# Patient Record
Sex: Male | Born: 1975 | Hispanic: No | Marital: Single | Smoking: Never smoker
Health system: Southern US, Community
[De-identification: ages and names within clinical notes are randomized; demographics above are authoritative.]

---

## 1999-12-14 ENCOUNTER — Emergency Department (HOSPITAL_COMMUNITY): Admission: EM | Admit: 1999-12-14 | Discharge: 1999-12-14 | Payer: Self-pay | Admitting: Emergency Medicine

## 1999-12-14 ENCOUNTER — Encounter: Payer: Self-pay | Admitting: Emergency Medicine

## 2003-02-18 ENCOUNTER — Observation Stay (HOSPITAL_COMMUNITY): Admission: RE | Admit: 2003-02-18 | Discharge: 2003-02-19 | Payer: Self-pay | Admitting: Specialist

## 2003-02-18 ENCOUNTER — Encounter: Payer: Self-pay | Admitting: Specialist

## 2019-02-17 ENCOUNTER — Emergency Department (HOSPITAL_COMMUNITY): Payer: Managed Care, Other (non HMO)

## 2019-02-17 ENCOUNTER — Emergency Department (HOSPITAL_COMMUNITY)
Admission: EM | Admit: 2019-02-17 | Discharge: 2019-02-17 | Disposition: A | Discharge status: Home / Self Care | Payer: Managed Care, Other (non HMO) | Source: Home / Self Care | Attending: Emergency Medicine | Admitting: Emergency Medicine

## 2019-02-17 ENCOUNTER — Encounter (HOSPITAL_COMMUNITY): Payer: Self-pay | Admitting: Emergency Medicine

## 2019-02-17 ENCOUNTER — Other Ambulatory Visit: Payer: Self-pay

## 2019-02-17 DIAGNOSIS — R0902 Hypoxemia: Secondary | ICD-10-CM | POA: Diagnosis not present

## 2019-02-17 DIAGNOSIS — U071 COVID-19: Secondary | ICD-10-CM | POA: Diagnosis not present

## 2019-02-17 DIAGNOSIS — E860 Dehydration: Secondary | ICD-10-CM

## 2019-02-17 LAB — CBC WITH DIFFERENTIAL/PLATELET
Abs Immature Granulocytes: 0.01 10*3/uL (ref 0.00–0.07)
Basophils Absolute: 0 10*3/uL (ref 0.0–0.1)
Basophils Relative: 0 %
Eosinophils Absolute: 0 10*3/uL (ref 0.0–0.5)
Eosinophils Relative: 0 %
HCT: 50.4 % (ref 39.0–52.0)
Hemoglobin: 15 g/dL (ref 13.0–17.0)
Immature Granulocytes: 0 %
Lymphocytes Relative: 19 %
Lymphs Abs: 1 10*3/uL (ref 0.7–4.0)
MCH: 22.3 pg — ABNORMAL LOW (ref 26.0–34.0)
MCHC: 29.8 g/dL — ABNORMAL LOW (ref 30.0–36.0)
MCV: 75 fL — ABNORMAL LOW (ref 80.0–100.0)
Monocytes Absolute: 0.3 10*3/uL (ref 0.1–1.0)
Monocytes Relative: 6 %
Neutro Abs: 3.9 10*3/uL (ref 1.7–7.7)
Neutrophils Relative %: 75 %
Platelets: 128 10*3/uL — ABNORMAL LOW (ref 150–400)
RBC: 6.72 MIL/uL — ABNORMAL HIGH (ref 4.22–5.81)
RDW: 16 % — ABNORMAL HIGH (ref 11.5–15.5)
WBC: 5.2 10*3/uL (ref 4.0–10.5)
nRBC: 0 % (ref 0.0–0.2)

## 2019-02-17 LAB — COMPREHENSIVE METABOLIC PANEL
ALT: 66 U/L — ABNORMAL HIGH (ref 0–44)
AST: 49 U/L — ABNORMAL HIGH (ref 15–41)
Albumin: 3.6 g/dL (ref 3.5–5.0)
Alkaline Phosphatase: 102 U/L (ref 38–126)
Anion gap: 8 (ref 5–15)
BUN: 17 mg/dL (ref 6–20)
CO2: 20 mmol/L — ABNORMAL LOW (ref 22–32)
Calcium: 10.5 mg/dL — ABNORMAL HIGH (ref 8.9–10.3)
Chloride: 107 mmol/L (ref 98–111)
Creatinine, Ser: 0.92 mg/dL (ref 0.61–1.24)
GFR calc Af Amer: >60 mL/min (ref >60)
GFR calc non Af Amer: >60 mL/min (ref >60)
Glucose, Bld: 123 mg/dL — ABNORMAL HIGH (ref 70–99)
Potassium: 3.9 mmol/L (ref 3.5–5.1)
Sodium: 135 mmol/L (ref 135–145)
Total Bilirubin: 0.7 mg/dL (ref 0.3–1.2)
Total Protein: 6.8 g/dL (ref 6.5–8.1)

## 2019-02-17 LAB — SARS CORONAVIRUS 2 BY RT PCR (HOSPITAL ORDER, PERFORMED IN ~~LOC~~ HOSPITAL LAB): SARS Coronavirus 2: POSITIVE — AB

## 2019-02-17 LAB — TRIGLYCERIDES: Triglycerides: 101 mg/dL (ref <150)

## 2019-02-17 LAB — D-DIMER, QUANTITATIVE: D-Dimer, Quant: 0.49 ug/mL-FEU (ref 0.00–0.50)

## 2019-02-17 LAB — FERRITIN: Ferritin: 739 ng/mL — ABNORMAL HIGH (ref 24–336)

## 2019-02-17 LAB — C-REACTIVE PROTEIN: CRP: 4 mg/dL — ABNORMAL HIGH (ref <1.0)

## 2019-02-17 LAB — FIBRINOGEN: Fibrinogen: 454 mg/dL (ref 210–475)

## 2019-02-17 LAB — PROCALCITONIN: Procalcitonin: 0.14 ng/mL

## 2019-02-17 LAB — LACTIC ACID, PLASMA: Lactic Acid, Venous: 0.9 mmol/L (ref 0.5–1.9)

## 2019-02-17 LAB — LACTATE DEHYDROGENASE: LDH: 216 U/L — ABNORMAL HIGH (ref 98–192)

## 2019-02-17 MED ORDER — SODIUM CHLORIDE 0.9 % IV BOLUS
1000.0000 mL | Freq: Once | INTRAVENOUS | Status: AC
  Administered 2019-02-17: 04:00:00 1000 mL via INTRAVENOUS

## 2019-02-17 MED ORDER — METOCLOPRAMIDE HCL 5 MG/ML IJ SOLN
10.0000 mg | Freq: Once | INTRAMUSCULAR | Status: AC
  Administered 2019-02-17: 04:00:00 10 mg via INTRAVENOUS
  Filled 2019-02-17: qty 2

## 2019-02-17 MED ORDER — BENZONATATE 100 MG PO CAPS: 100.0000 mg | ORAL_CAPSULE | Freq: Three times a day (TID) | ORAL | 0 refills | Status: AC

## 2019-02-17 MED ORDER — ONDANSETRON HCL 4 MG PO TABS: 4.0000 mg | ORAL_TABLET | Freq: Three times a day (TID) | ORAL | 0 refills | Status: AC | PRN

## 2019-02-17 NOTE — Discharge Instructions (Addendum)
You tested positive for covid. This is a virus that needs to be treated symptomatically.  Use zofran as needed for nausea and vomiting.  Make sure you are staying well hydrated with water.  Use tylenol/ibuprofen as needed for fevers and/or body aches.  Use tessalon as needed for cough.  Isolate/quarantine until 7 days after symptoms began as long as you are also fever free for 72 hours without medication and your symptoms are improving.  Monitor your symptoms closely. If you are having increased difficulty breathing, return to the ER. Return with any new, worsening, or concerning symptoms.

## 2019-02-17 NOTE — ED Triage Notes (Signed)
Patient BIB [facility] from home for dizziness, weakness, emesis, loss of taste and smell. Patients son and several other family members have tested positive for covid-19. Patient has not been tested. Patient reports taking alive for fever at home. Pt ambulatory but dizzy and states the room is spinning when lying down. Pt given zofran by ems that helped briefly as well as 500 ml NS.

## 2019-02-17 NOTE — ED Provider Notes (Signed)
[facility]-EMERGENCY DEPT Provider Note   CSN: [id number] Arrival date & time: 02/17/19  0123     History   Chief Complaint Chief Complaint  Patient presents with  . Dizziness  . Emesis    HPI George Gibson is a 43 y.o. male presenting for evaluation of dizziness, vomiting, fever, loss of taste/smell.  Patient states he started to develop symptoms 5 days ago.  Each day he develops any symptoms.  It began with myalgias, and he developed a fever, the loss of taste/smell.  He has developed a mild cough.  Today he has had multiple episodes of vomiting and reports dizziness, this is worse when sitting/standing.  Patient states all of his family members have tested positive for.  Including his mom, dad, and brother.  His brother was sick first.  Patient has not been tested.  He has not been taking anything for his symptoms.  He denies chest pain, shortness of breath, abdominal pain, urinary symptoms, abnormal bowel movements.  He reports a history of asthma for which he takes his inhaler every day, denies wheezing.  States he has no other medical problems.     HPI  History reviewed. No pertinent past medical history.  There are no active problems to display for this patient.   History reviewed. No pertinent surgical history.      Home Medications    Prior to Admission medications   Medication Sig Start Date End Date Taking? Authorizing Provider  naproxen sodium (ALEVE) 220 MG tablet Take 440 mg by mouth 2 (two) times daily as needed (pain).   Yes [provider]  benzonatate (TESSALON) 100 MG capsule Take 1 capsule (100 mg total) by mouth every 8 (eight) hours. 02/17/19   Dwyane Dupree, PA-C  ondansetron (ZOFRAN) 4 MG tablet Take 1 tablet (4 mg total) by mouth every 8 (eight) hours as needed. 02/17/19   Debria Broecker, PA-C    Family History History reviewed. No pertinent family history.  Social History Social History   Tobacco Use  .  Smoking status: Never Smoker  . Smokeless tobacco: Never Used  Substance Use Topics  . Alcohol use: Never    Frequency: Never  . Drug use: Never     Allergies   Patient has no known allergies.   Review of Systems Review of Systems  Constitutional: Positive for fever.  Respiratory: Positive for cough.   Gastrointestinal: Positive for nausea and vomiting.  Musculoskeletal: Positive for myalgias.  Neurological: Positive for dizziness and weakness.  All other systems reviewed and are negative.    Physical Exam Updated Vital Signs BP 117/81   Pulse 87   Temp 98.6 F (37 C) (Oral)   Resp 17   Ht 5\' 8"  (1.727 m)   Wt 127 kg   SpO2 92%   BMI 42.57 kg/m   Physical Exam Vitals signs and nursing note reviewed.  Constitutional:      General: He is not in acute distress.    Appearance: He is well-developed.     Comments: Obese M who appears nontoxic  HENT:     Head: Normocephalic and atraumatic.  Eyes:     Extraocular Movements: Extraocular movements intact.     Conjunctiva/sclera: Conjunctivae normal.     Pupils: Pupils are equal, round, and reactive to light.  Neck:     Musculoskeletal: Normal range of motion and neck supple.  Cardiovascular:     Rate and Rhythm: Normal rate and regular rhythm.  Pulses: Normal pulses.  Pulmonary:     Effort: Pulmonary effort is normal. No respiratory distress.     Breath sounds: Normal breath sounds. No wheezing.     Comments: Speaking in full sentences.  Clear lung sounds in all fields.  No signs of respiratory distress.  Patient ambulated without significant tachypnea or difficulty breathing. Abdominal:     General: There is no distension.     Palpations: Abdomen is soft. There is no mass.     Tenderness: There is no abdominal tenderness. There is no guarding or rebound.  Musculoskeletal: Normal range of motion.  Skin:    General: Skin is warm and dry.     Capillary Refill: Capillary refill takes less than 2 seconds.   Neurological:     Mental Status: He is alert and oriented to person, place, and time.      ED Treatments / Results  Labs (all labs ordered are listed, but only abnormal results are displayed) Labs Reviewed  SARS CORONAVIRUS 2 (HOSPITAL ORDER, PERFORMED IN [facility]) - Abnormal; Notable for the following components:      Result Value   SARS Coronavirus 2 POSITIVE (*)    All other components within normal limits  CBC WITH DIFFERENTIAL/PLATELET - Abnormal; Notable for the following components:   RBC 6.72 (*)    MCV 75.0 (*)    MCH 22.3 (*)    MCHC 29.8 (*)    RDW 16.0 (*)    Platelets 128 (*)    All other components within normal limits  COMPREHENSIVE METABOLIC PANEL - Abnormal; Notable for the following components:   CO2 20 (*)    Glucose, Bld 123 (*)    Calcium 10.5 (*)    AST 49 (*)    ALT 66 (*)    All other components within normal limits  LACTATE DEHYDROGENASE - Abnormal; Notable for the following components:   LDH 216 (*)    All other components within normal limits  FERRITIN - Abnormal; Notable for the following components:   Ferritin 739 (*)    All other components within normal limits  C-REACTIVE PROTEIN - Abnormal; Notable for the following components:   CRP 4.0 (*)    All other components within normal limits  CULTURE, BLOOD (ROUTINE X 2)  CULTURE, BLOOD (ROUTINE X 2)  LACTIC ACID, PLASMA  D-DIMER, QUANTITATIVE (NOT [facility])  PROCALCITONIN  TRIGLYCERIDES  FIBRINOGEN  LACTIC ACID, PLASMA    EKG EKG Interpretation  Date/Time:  Saturday February 17 2019 02:50:21 EDT Ventricular Rate:  89 PR Interval:    QRS Duration: 94 QT Interval:  346 QTC Calculation: 421 R Axis:   19 Text Interpretation:  Sinus rhythm Low voltage, precordial leads RSR' in V1 or V2, right VCD or RVH ST elev, probable normal early repol pattern No acute changes No old tracing to compare Confirmed by Derwood Kaplananavati, Ankit [phone]([id number]) on 02/17/2019 3:53:07 AM   Radiology Dg Chest  Port 1 View  Result Date: 02/17/2019 CLINICAL DATA:  Shortness of breath history of COVID-19 exposure EXAM: PORTABLE CHEST 1 VIEW COMPARISON:  None. FINDINGS: No focal airspace disease or effusion. Normal cardiomediastinal silhouette. No pneumothorax. IMPRESSION: No active disease. Electronically Signed   By: Jasmine PangKim  Fujinaga M.D.   On: 02/17/2019 03:51    Procedures Procedures (including critical care time)  Medications Ordered in ED Medications  sodium chloride 0.9 % bolus 1,000 mL (1,000 mLs Intravenous New Bag/Given 02/17/19 0344)  metoCLOPramide (REGLAN) injection 10 mg (10 mg  Intravenous Given 02/17/19 0345)     Initial Impression / Assessment and Plan / ED Course  I have reviewed the triage vital signs and the nursing notes.  Pertinent labs & imaging results that were available during my care of the patient were reviewed by me and considered in my medical decision making (see chart for details).        Patient presenting for evaluation of COVID-like symptoms.  Physical exam shows patient appears nontoxic.  He is reporting dizziness today, which is present all the time, though worse with standing.  I ambulated patient in the room, became slightly tachycardic upon standing, but no significant change in his blood pressure.  He was able to ambulate without respiratory distress.  Will order COVID labs and test.  Will order chest x-ray.  Fluid bolus given for likely dehydration, Reglan for nausea.  EKG without stemi, but shows signs of possible PE. Will follow ddimer to see if pt needs to have cta.  Chest x-ray viewed interpreted by me, no pneumonia pneumothorax, effusion, cardiomegaly.  Labs consistent with COVID.  No leukocytosis.  CRP elevated.  LDH and ferritin elevated.  Dimer negative, will hold on CTA at this time.  On reassessment, patient reports his dizziness is improved.  He was able to sit up and move around without recurrence of dizziness.  As such, likely due to dehydration.   Low suspicion for stroke at this time.  Discussed findings with patient.  Discussed close monitoring of symptoms, and returning if respiratory status worsens.  At this time, patient appears safe for discharge.  Return precautions given.  Patient states he understands and agrees to plan.  George Gibson was evaluated in Emergency Department on 02/17/2019 for the symptoms described in the history of present illness. He was evaluated in the context of the global COVID-19 pandemic, which necessitated consideration that the patient might be at risk for infection with the SARS-CoV-2 virus that causes COVID-19. Institutional protocols and algorithms that pertain to the evaluation of patients at risk for COVID-19 are in a state of rapid change based on information released by regulatory bodies including the CDC and federal and state organizations. These policies and algorithms were followed during the patient's care in the ED.  Final Clinical Impressions(s) / ED Diagnoses   Final diagnoses:  COVID-19  Dehydration    ED Discharge Orders         Ordered    ondansetron (ZOFRAN) 4 MG tablet  Every 8 hours PRN     02/17/19 0615    benzonatate (TESSALON) 100 MG capsule  Every 8 hours     02/17/19 0615           Azara Gemme, PA-C 02/17/19 0347    Varney Biles, MD 02/18/19 [phone]

## 2019-02-20 ENCOUNTER — Emergency Department (HOSPITAL_COMMUNITY): Payer: Managed Care, Other (non HMO)

## 2019-02-20 ENCOUNTER — Inpatient Hospital Stay (HOSPITAL_COMMUNITY)
Admission: EM | Admit: 2019-02-20 | Discharge: 2019-02-26 | DRG: 177 | Disposition: A | Discharge status: Home / Self Care | Payer: Managed Care, Other (non HMO) | Attending: Family Medicine | Admitting: Family Medicine

## 2019-02-20 ENCOUNTER — Encounter (HOSPITAL_COMMUNITY): Payer: Self-pay | Admitting: Emergency Medicine

## 2019-02-20 DIAGNOSIS — Z7951 Long term (current) use of inhaled steroids: Secondary | ICD-10-CM | POA: Diagnosis not present

## 2019-02-20 DIAGNOSIS — U071 COVID-19: Secondary | ICD-10-CM | POA: Diagnosis not present

## 2019-02-20 DIAGNOSIS — J069 Acute upper respiratory infection, unspecified: Secondary | ICD-10-CM

## 2019-02-20 DIAGNOSIS — Z6841 Body Mass Index (BMI) 40.0 and over, adult: Secondary | ICD-10-CM

## 2019-02-20 DIAGNOSIS — R0902 Hypoxemia: Secondary | ICD-10-CM

## 2019-02-20 DIAGNOSIS — J45909 Unspecified asthma, uncomplicated: Secondary | ICD-10-CM | POA: Diagnosis present

## 2019-02-20 DIAGNOSIS — Z79899 Other long term (current) drug therapy: Secondary | ICD-10-CM

## 2019-02-20 DIAGNOSIS — E785 Hyperlipidemia, unspecified: Secondary | ICD-10-CM | POA: Diagnosis present

## 2019-02-20 DIAGNOSIS — E860 Dehydration: Secondary | ICD-10-CM | POA: Diagnosis present

## 2019-02-20 DIAGNOSIS — J1289 Other viral pneumonia: Secondary | ICD-10-CM | POA: Diagnosis present

## 2019-02-20 DIAGNOSIS — E875 Hyperkalemia: Secondary | ICD-10-CM | POA: Diagnosis present

## 2019-02-20 DIAGNOSIS — J9601 Acute respiratory failure with hypoxia: Secondary | ICD-10-CM | POA: Diagnosis present

## 2019-02-20 DIAGNOSIS — N179 Acute kidney failure, unspecified: Secondary | ICD-10-CM | POA: Diagnosis present

## 2019-02-20 LAB — CBC WITH DIFFERENTIAL/PLATELET
Abs Immature Granulocytes: 0.02 10*3/uL (ref 0.00–0.07)
Basophils Absolute: 0 10*3/uL (ref 0.0–0.1)
Basophils Relative: 0 %
Eosinophils Absolute: 0 10*3/uL (ref 0.0–0.5)
Eosinophils Relative: 0 %
HCT: 52.8 % — ABNORMAL HIGH (ref 39.0–52.0)
Hemoglobin: 15.5 g/dL (ref 13.0–17.0)
Immature Granulocytes: 0 %
Lymphocytes Relative: 15 %
Lymphs Abs: 1 10*3/uL (ref 0.7–4.0)
MCH: 21.8 pg — ABNORMAL LOW (ref 26.0–34.0)
MCHC: 29.4 g/dL — ABNORMAL LOW (ref 30.0–36.0)
MCV: 74.2 fL — ABNORMAL LOW (ref 80.0–100.0)
Monocytes Absolute: 0.3 10*3/uL (ref 0.1–1.0)
Monocytes Relative: 4 %
Neutro Abs: 5.4 10*3/uL (ref 1.7–7.7)
Neutrophils Relative %: 81 %
Platelets: 190 10*3/uL (ref 150–400)
RBC: 7.12 MIL/uL — ABNORMAL HIGH (ref 4.22–5.81)
RDW: 15.6 % — ABNORMAL HIGH (ref 11.5–15.5)
WBC: 6.7 10*3/uL (ref 4.0–10.5)
nRBC: 0 % (ref 0.0–0.2)

## 2019-02-20 LAB — D-DIMER, QUANTITATIVE: D-Dimer, Quant: 0.81 ug/mL-FEU — ABNORMAL HIGH (ref 0.00–0.50)

## 2019-02-20 LAB — LACTIC ACID, PLASMA: Lactic Acid, Venous: 1.2 mmol/L (ref 0.5–1.9)

## 2019-02-20 LAB — COMPREHENSIVE METABOLIC PANEL
ALT: 59 U/L — ABNORMAL HIGH (ref 0–44)
AST: 114 U/L — ABNORMAL HIGH (ref 15–41)
Albumin: 3.6 g/dL (ref 3.5–5.0)
Alkaline Phosphatase: 80 U/L (ref 38–126)
Anion gap: 11 (ref 5–15)
BUN: 17 mg/dL (ref 6–20)
CO2: 22 mmol/L (ref 22–32)
Calcium: 10.8 mg/dL — ABNORMAL HIGH (ref 8.9–10.3)
Chloride: 100 mmol/L (ref 98–111)
Creatinine, Ser: 1.4 mg/dL — ABNORMAL HIGH (ref 0.61–1.24)
GFR calc Af Amer: >60 mL/min (ref >60)
GFR calc non Af Amer: >60 mL/min (ref >60)
Glucose, Bld: 91 mg/dL (ref 70–99)
Potassium: 4.4 mmol/L (ref 3.5–5.1)
Sodium: 133 mmol/L — ABNORMAL LOW (ref 135–145)
Total Bilirubin: 0.8 mg/dL (ref 0.3–1.2)
Total Protein: 7.5 g/dL (ref 6.5–8.1)

## 2019-02-20 LAB — C-REACTIVE PROTEIN: CRP: 15 mg/dL — ABNORMAL HIGH (ref <1.0)

## 2019-02-20 LAB — FERRITIN: Ferritin: 1346 ng/mL — ABNORMAL HIGH (ref 24–336)

## 2019-02-20 LAB — LACTATE DEHYDROGENASE: LDH: 550 U/L — ABNORMAL HIGH (ref 98–192)

## 2019-02-20 LAB — FIBRINOGEN: Fibrinogen: 292 mg/dL (ref 210–475)

## 2019-02-20 LAB — TRIGLYCERIDES: Triglycerides: 94 mg/dL (ref <150)

## 2019-02-20 LAB — PROCALCITONIN: Procalcitonin: 0.79 ng/mL

## 2019-02-20 MED ORDER — SODIUM CHLORIDE 0.9 % IV SOLN
500.0000 mg | INTRAVENOUS | Status: DC
  Administered 2019-02-20: 500 mg via INTRAVENOUS
  Filled 2019-02-20: qty 500

## 2019-02-20 MED ORDER — ENOXAPARIN SODIUM 40 MG/0.4ML ~~LOC~~ SOLN: 40.0000 mg | SUBCUTANEOUS | Status: DC

## 2019-02-20 MED ORDER — ALBUTEROL SULFATE HFA 108 (90 BASE) MCG/ACT IN AERS
1.0000 | INHALATION_SPRAY | Freq: Four times a day (QID) | RESPIRATORY_TRACT | Status: DC | PRN
  Filled 2019-02-20: qty 6.7

## 2019-02-20 MED ORDER — VITAMIN C 500 MG PO TABS
500.0000 mg | ORAL_TABLET | Freq: Every day | ORAL | Status: DC
  Administered 2019-02-21 – 2019-02-26 (×6): 500 mg via ORAL
  Filled 2019-02-20 (×6): qty 1

## 2019-02-20 MED ORDER — ONDANSETRON HCL 4 MG/2ML IJ SOLN: 4.0000 mg | Freq: Four times a day (QID) | INTRAMUSCULAR | Status: DC | PRN

## 2019-02-20 MED ORDER — POLYETHYLENE GLYCOL 3350 17 G PO PACK: 17.0000 g | PACK | Freq: Every day | ORAL | Status: DC | PRN

## 2019-02-20 MED ORDER — BENZONATATE 100 MG PO CAPS: 100.0000 mg | ORAL_CAPSULE | Freq: Three times a day (TID) | ORAL | Status: DC

## 2019-02-20 MED ORDER — METHYLPREDNISOLONE SODIUM SUCC 125 MG IJ SOLR
60.0000 mg | Freq: Two times a day (BID) | INTRAMUSCULAR | Status: DC
  Administered 2019-02-20: 60 mg via INTRAVENOUS
  Filled 2019-02-20: qty 2

## 2019-02-20 MED ORDER — MOMETASONE FURO-FORMOTEROL FUM 200-5 MCG/ACT IN AERO
2.0000 | INHALATION_SPRAY | Freq: Two times a day (BID) | RESPIRATORY_TRACT | Status: DC
  Filled 2019-02-20: qty 8.8

## 2019-02-20 MED ORDER — ENOXAPARIN SODIUM 60 MG/0.6ML ~~LOC~~ SOLN
60.0000 mg | SUBCUTANEOUS | Status: DC
  Administered 2019-02-20 – 2019-02-25 (×6): 60 mg via SUBCUTANEOUS
  Filled 2019-02-20 (×6): qty 0.6

## 2019-02-20 MED ORDER — GUAIFENESIN-DM 100-10 MG/5ML PO SYRP
10.0000 mL | ORAL_SOLUTION | ORAL | Status: DC | PRN
  Administered 2019-02-21 – 2019-02-26 (×9): 10 mL via ORAL
  Filled 2019-02-20 (×7): qty 10

## 2019-02-20 MED ORDER — ACETAMINOPHEN 325 MG PO TABS
650.0000 mg | ORAL_TABLET | Freq: Once | ORAL | Status: AC
  Administered 2019-02-20: 650 mg via ORAL
  Filled 2019-02-20: qty 2

## 2019-02-20 MED ORDER — DEXAMETHASONE SODIUM PHOSPHATE 10 MG/ML IJ SOLN
10.0000 mg | Freq: Once | INTRAMUSCULAR | Status: AC
  Administered 2019-02-20: 10 mg via INTRAVENOUS
  Filled 2019-02-20: qty 1

## 2019-02-20 MED ORDER — SODIUM CHLORIDE 0.9 % IV BOLUS
1000.0000 mL | Freq: Once | INTRAVENOUS | Status: AC
  Administered 2019-02-20: 1000 mL via INTRAVENOUS

## 2019-02-20 MED ORDER — ZINC SULFATE 220 (50 ZN) MG PO CAPS
220.0000 mg | ORAL_CAPSULE | Freq: Every day | ORAL | Status: DC
  Administered 2019-02-21 – 2019-02-26 (×6): 220 mg via ORAL
  Filled 2019-02-20 (×6): qty 1

## 2019-02-20 MED ORDER — ATORVASTATIN CALCIUM 10 MG PO TABS
20.0000 mg | ORAL_TABLET | Freq: Every day | ORAL | Status: DC
  Administered 2019-02-21 – 2019-02-26 (×6): 20 mg via ORAL
  Filled 2019-02-20 (×4): qty 1
  Filled 2019-02-20 (×5): qty 2
  Filled 2019-02-20: qty 1

## 2019-02-20 MED ORDER — LORATADINE 10 MG PO TABS
10.0000 mg | ORAL_TABLET | Freq: Every day | ORAL | Status: DC
  Administered 2019-02-21 – 2019-02-26 (×6): 10 mg via ORAL
  Filled 2019-02-20 (×6): qty 1

## 2019-02-20 MED ORDER — ONDANSETRON HCL 4 MG PO TABS: 4.0000 mg | ORAL_TABLET | Freq: Four times a day (QID) | ORAL | Status: DC | PRN

## 2019-02-20 MED ORDER — SODIUM CHLORIDE 0.9 % IV SOLN
100.0000 mg | INTRAVENOUS | Status: AC
  Administered 2019-02-21 – 2019-02-24 (×4): 100 mg via INTRAVENOUS
  Filled 2019-02-20 (×4): qty 20

## 2019-02-20 MED ORDER — SODIUM CHLORIDE 0.9 % IV SOLN
1.0000 g | INTRAVENOUS | Status: DC
  Administered 2019-02-20: 1 g via INTRAVENOUS
  Filled 2019-02-20: qty 10

## 2019-02-20 MED ORDER — SODIUM CHLORIDE 0.9 % IV SOLN: INTRAVENOUS | Status: DC

## 2019-02-20 MED ORDER — SODIUM CHLORIDE 0.9 % IV SOLN
200.0000 mg | Freq: Once | INTRAVENOUS | Status: AC
  Administered 2019-02-20: 200 mg via INTRAVENOUS
  Filled 2019-02-20: qty 40

## 2019-02-20 MED ORDER — TOCILIZUMAB 400 MG/20ML IV SOLN: 8.0000 mg/kg | Freq: Once | INTRAVENOUS | Status: DC

## 2019-02-20 MED ORDER — HYDROCOD POLST-CPM POLST ER 10-8 MG/5ML PO SUER
5.0000 mL | Freq: Two times a day (BID) | ORAL | Status: DC | PRN
  Administered 2019-02-20 – 2019-02-22 (×2): 5 mL via ORAL
  Filled 2019-02-20 (×3): qty 5

## 2019-02-20 MED ORDER — ACETAMINOPHEN 325 MG PO TABS: 650.0000 mg | ORAL_TABLET | Freq: Four times a day (QID) | ORAL | Status: DC | PRN

## 2019-02-20 MED ORDER — FLUTICASONE PROPIONATE 50 MCG/ACT NA SUSP
2.0000 | Freq: Every day | NASAL | Status: DC
  Administered 2019-02-21 – 2019-02-26 (×6): 2 via NASAL
  Filled 2019-02-20: qty 16

## 2019-02-20 NOTE — Progress Notes (Signed)
Pharmacy Brief Note: Remdesivir  O:  -COVID positive diagnosis on 02/17/2019, now returns with worsening cough, shortness of breath, dizziness, nausea -ALT: 59 -CXR: Interval development of multiple bilateral patchy airspace opacities is noted most consistent with multifocal PNA -EMS found patient with O2 sats in the 80s. Currently on 3L supplemental O2 with SpO2 94%.   A/P:  Patient meets criteria for Remdesivir therapy.  Will start Remdesivir 200mg  IV x 1, followed by 100mg  IV daily x 4 days.  CMET, CBC daily for duration of treatment course   Lindell Spar, PharmD, BCPS Pager: [phone] 02/20/2019 5:36 PM

## 2019-02-20 NOTE — ED Notes (Signed)
Hospitalist at bedside 

## 2019-02-20 NOTE — ED Notes (Signed)
[facility] Called for transport  

## 2019-02-20 NOTE — ED Triage Notes (Signed)
Pt to ED via EMS from home, pt to ED Saturday, confirmed positive COVID. Pt today c/o worsening cough, weakness, SHOB. Denies fever. Pt currently on [place] @ 4L, Sat 95-97%

## 2019-02-20 NOTE — H&P (Signed)
History and Physical  George GaleaSavann Mcnamee ZOX:[id number] DOB: 03/06/1976 DOA: 02/20/2019   Patient coming from: Home & is able to ambulate  Chief Complaint: Worsening cough, shortness of breath, fever  HPI: George Gibson is a 43 y.o. male with medical history significant for morbid obesity, presents to the ED complaining of worsening cough, shortness of breath, fever, weakness for the past couple of days.  Of note, patient presented on 02/17/2019 with similar complaints and tested positive for COVID, but did not meet inpatient criteria and was discharged.  Later returns today for worsening symptoms, in addition to nausea but no vomiting.  Reports multiple family members tested positive for COVID.  Due to worsening symptoms, patient decided to return to the ED.  ED Course: Patient noted to be satting in the 80s on room air, was placed on nonrebreather by EMS and transported to the ED.  Currently satting in the low 90s on 3 L nasal cannula.  Patient noted to be febrile, tachycardic.  Inflammatory markers are worsening.  Patient noted to have some AKI.  X-ray showed multifocal pneumonia.  [facility]  Review of Systems: Review of systems are otherwise negative   History reviewed. No pertinent past medical history. History reviewed. No pertinent surgical history.  Social History:  reports that he has never smoked. He has never used smokeless tobacco. He reports that he does not drink alcohol or use drugs.   No Known Allergies  No family history on file.    Prior to Admission medications   Medication Sig Start Date End Date Taking? Authorizing Provider  ADVAIR HFA 115-21 MCG/ACT inhaler Inhale 1 puff into the lungs 2 (two) times a day. 11/13/18  Yes [provider]  albuterol (VENTOLIN HFA) 108 (90 Base) MCG/ACT inhaler Inhale 1-2 puffs into the lungs every 6 (six) hours as needed for wheezing or shortness of breath.  09/27/18  Yes [provider]   amphetamine-dextroamphetamine (ADDERALL) 30 MG tablet Take 30 mg by mouth daily. 01/06/19  Yes [provider]  atorvastatin (LIPITOR) 20 MG tablet Take 20 mg by mouth daily. 10/06/18  Yes [provider]  benzonatate (TESSALON) 100 MG capsule Take 1 capsule (100 mg total) by mouth every 8 (eight) hours. 02/17/19  Yes Caccavale, Sophia, PA-C  cetirizine (ZYRTEC) 10 MG tablet Take 10 mg by mouth daily. 02/04/19  Yes [provider]  fluticasone (FLONASE) 50 MCG/ACT nasal spray Place 2 sprays into both nostrils daily. 12/12/18  Yes [provider]  naproxen sodium (ALEVE) 220 MG tablet Take 440 mg by mouth 2 (two) times daily as needed (pain).   Yes [provider]  ondansetron (ZOFRAN) 4 MG tablet Take 1 tablet (4 mg total) by mouth every 8 (eight) hours as needed. Patient taking differently: Take 4 mg by mouth every 8 (eight) hours as needed for nausea.  02/17/19  Yes Caccavale, Sophia, PA-C    Physical Exam: BP 99/72   Pulse (!) 124   Temp (!) 103 F (39.4 C) (Oral)   Resp (!) 32   Ht 5\' 8"  (1.727 m)   Wt 127 kg   SpO2 94%   BMI 42.57 kg/m   General: Mild distress noted Eyes: Normal ENT: Normal Neck: Supple Cardiovascular: S1, S2 present Respiratory: Poor inspiratory effort Abdomen: Soft, nontender, nondistended, bowel sounds present Skin: Normal Musculoskeletal: No pedal edema bilaterally Psychiatric: Normal mood Neurologic: No focal neurologic deficits noted          Labs on Admission:  Basic Metabolic Panel: Recent Labs  Lab 02/17/19 0309 02/20/19 1350  NA 135 133*  K 3.9 4.4  CL 107 100  CO2 20* 22  GLUCOSE 123* 91  BUN 17 17  CREATININE 0.92 1.40*  CALCIUM 10.5* 10.8*   Liver Function Tests: Recent Labs  Lab 02/17/19 0309 02/20/19 1350  AST 49* 114*  ALT 66* 59*  ALKPHOS 102 80  BILITOT 0.7 0.8  PROT 6.8 7.5  ALBUMIN 3.6 3.6   No results for input(s): LIPASE, AMYLASE in the last 168 hours. No results for  input(s): AMMONIA in the last 168 hours. CBC: Recent Labs  Lab 02/17/19 0309 02/20/19 1350  WBC 5.2 6.7  NEUTROABS 3.9 5.4  HGB 15.0 15.5  HCT 50.4 52.8*  MCV 75.0* 74.2*  PLT 128* 190   Cardiac Enzymes: No results for input(s): CKTOTAL, CKMB, CKMBINDEX, TROPONINI in the last 168 hours.  BNP (last 3 results) No results for input(s): BNP in the last 8760 hours.  ProBNP (last 3 results) No results for input(s): PROBNP in the last 8760 hours.  CBG: No results for input(s): GLUCAP in the last 168 hours.  Radiological Exams on Admission: Dg Chest Port 1 View  Result Date: 02/20/2019 CLINICAL DATA:  Cough, shortness of breath. EXAM: PORTABLE CHEST 1 VIEW COMPARISON:  Radiograph February 17, 2019. FINDINGS: Stable cardiomediastinal silhouette. Interval development of multiple patchy airspace opacities throughout both lungs. No pneumothorax or pleural effusion is noted. The visualized skeletal structures are unremarkable. IMPRESSION: Interval development of multiple bilateral patchy airspace opacities is noted most consistent with multifocal pneumonia. Electronically Signed   By: Marijo Conception M.D.   On: 02/20/2019 15:02    EKG: Independently reviewed.  No acute ST changes  Assessment/Plan Present on Admission: . COVID-19 virus infection  Principal Problem:   COVID-19 virus infection  Pneumonia 2/2 COVID-19 virus Febrile, with no leukocytosis Inflammatory markers elevated: CRP 15, procalcitonin 0.79, d-dimer 0.81 BC x2 pending Chest x-ray with multifocal pneumonia Gave 1 dose of Decadron, continue IV Solu-Medrol Started IV ceftriaxone, azithromycin Started remdesivir and actemra Supplemental oxygen, inhalers Transferred to [facility] for further management  AKI Likely due to poor oral intake IV fluids  Morbid obesity Lifestyle modification advised      DVT prophylaxis: Lovenox  Code Status: Full  Family Communication: No family at bedside   Disposition Plan: To be determined  Consults called: None  Admission status: Inpatient    Alma Friendly MD [facility]   If 7PM-7AM, please contact night-coverage [email or web address]   02/20/2019, 6:00 PM

## 2019-02-20 NOTE — ED Provider Notes (Signed)
[facility]-EMERGENCY DEPT Provider Note   CSN: [id number] Arrival date & time: 02/20/19  1310     History   Chief Complaint Chief Complaint  Patient presents with  . Cough    COVID +    HPI George Gibson is a 43 y.o. male.  He has no significant past medical history.  He was evaluated last week for body aches fever cough.  He had multiple Covid positive family members.  He was diagnosed with Covid and felt to be appropriate for discharge.  He said he did well for a few days but now has been experiencing more cough that he thinks might have a little bit of blood in it along with more shortness of breath and he said he has been feeling dizzy, worse with standing.  He has had some nausea but no vomiting.  EMS found him with sats in the 80s and put him on a nonrebreather and transported him here.  Currently satting in the low 90s on 3 L nasal.     The history is provided by the patient.  Shortness of Breath Severity:  Moderate Onset quality:  Gradual Timing:  Constant Progression:  Worsening Chronicity:  New Context: activity   Relieved by:  Nothing Worsened by:  Activity Ineffective treatments:  None tried Associated symptoms: cough, fever, hemoptysis and sputum production   Associated symptoms: no abdominal pain, no chest pain, no rash, no sore throat, no vomiting and no wheezing     No past medical history on file.  Patient Active Problem List   Diagnosis Date Noted  . COVID-19 virus infection 02/20/2019    No past surgical history on file.      Home Medications    Prior to Admission medications   Medication Sig Start Date End Date Taking? Authorizing Provider  benzonatate (TESSALON) 100 MG capsule Take 1 capsule (100 mg total) by mouth every 8 (eight) hours. 02/17/19   Caccavale, Sophia, PA-C  naproxen sodium (ALEVE) 220 MG tablet Take 440 mg by mouth 2 (two) times daily as needed (pain).    [provider]  ondansetron (ZOFRAN) 4 MG  tablet Take 1 tablet (4 mg total) by mouth every 8 (eight) hours as needed. 02/17/19   Caccavale, Sophia, PA-C    Family History No family history on file.  Social History Social History   Tobacco Use  . Smoking status: Never Smoker  . Smokeless tobacco: Never Used  Substance Use Topics  . Alcohol use: Never    Frequency: Never  . Drug use: Never     Allergies   Patient has no known allergies.   Review of Systems Review of Systems  Constitutional: Positive for fever.  HENT: Negative for sore throat.   Eyes: Negative for visual disturbance.  Respiratory: Positive for cough, hemoptysis, sputum production and shortness of breath. Negative for wheezing.   Cardiovascular: Negative for chest pain.  Gastrointestinal: Positive for nausea. Negative for abdominal pain and vomiting.  Genitourinary: Negative for dysuria.  Musculoskeletal: Positive for myalgias.  Skin: Negative for rash.  Neurological: Positive for dizziness.     Physical Exam Updated Vital Signs Ht 5\' 8"  (1.727 m)   Wt 127 kg   SpO2 97%   BMI 42.57 kg/m   Physical Exam Vitals signs and nursing note reviewed.  Constitutional:      Appearance: He is well-developed. He is obese. He is ill-appearing.  HENT:     Head: Normocephalic and atraumatic.  Eyes:  Conjunctiva/sclera: Conjunctivae normal.  Neck:     Musculoskeletal: Neck supple.  Cardiovascular:     Rate and Rhythm: Regular rhythm. Tachycardia present.     Pulses: Normal pulses.     Heart sounds: No murmur.  Pulmonary:     Effort: Pulmonary effort is normal. Tachypnea present. No respiratory distress.     Breath sounds: Normal breath sounds.  Abdominal:     Palpations: Abdomen is soft.     Tenderness: There is no abdominal tenderness.  Musculoskeletal: Normal range of motion.        General: No tenderness.     Right lower leg: No edema.     Left lower leg: No edema.  Skin:    General: Skin is warm and dry.     Capillary Refill:  Capillary refill takes less than 2 seconds.  Neurological:     General: No focal deficit present.     Mental Status: He is alert and oriented to person, place, and time.     Sensory: No sensory deficit.     Motor: No weakness.      ED Treatments / Results  Labs (all labs ordered are listed, but only abnormal results are displayed) Labs Reviewed  CBC WITH DIFFERENTIAL/PLATELET - Abnormal; Notable for the following components:      Result Value   RBC 7.12 (*)    HCT 52.8 (*)    MCV 74.2 (*)    MCH 21.8 (*)    MCHC 29.4 (*)    RDW 15.6 (*)    All other components within normal limits  COMPREHENSIVE METABOLIC PANEL - Abnormal; Notable for the following components:   Sodium 133 (*)    Creatinine, Ser 1.40 (*)    Calcium 10.8 (*)    AST 114 (*)    ALT 59 (*)    All other components within normal limits  D-DIMER, QUANTITATIVE (NOT AT [facility]) - Abnormal; Notable for the following components:   D-Dimer, Quant 0.81 (*)    All other components within normal limits  LACTATE DEHYDROGENASE - Abnormal; Notable for the following components:   LDH 550 (*)    All other components within normal limits  FERRITIN - Abnormal; Notable for the following components:   Ferritin 1,346 (*)    All other components within normal limits  C-REACTIVE PROTEIN - Abnormal; Notable for the following components:   CRP 15.0 (*)    All other components within normal limits  CULTURE, BLOOD (ROUTINE X 2)  CULTURE, BLOOD (ROUTINE X 2)  LACTIC ACID, PLASMA  PROCALCITONIN  TRIGLYCERIDES  FIBRINOGEN    EKG EKG Interpretation  Date/Time:  Tuesday February 20 2019 14:38:52 EDT Ventricular Rate:  129 PR Interval:    QRS Duration: 87 QT Interval:  272 QTC Calculation: 399 R Axis:   47 Text Interpretation:  Sinus tachycardia Low voltage, precordial leads Borderline T abnormalities, inferior leads ST elevation, consider lateral injury Baseline wander in lead(s) I II aVR aVF V3 increased rate otherwise similar to  prior 7/20 Confirmed by Meridee ScoreButler, Ashlinn Hemrick [phone]([id number]) on 02/20/2019 2:50:43 PM   Radiology Dg Chest Port 1 View  Result Date: 02/20/2019 CLINICAL DATA:  Cough, shortness of breath. EXAM: PORTABLE CHEST 1 VIEW COMPARISON:  Radiograph February 17, 2019. FINDINGS: Stable cardiomediastinal silhouette. Interval development of multiple patchy airspace opacities throughout both lungs. No pneumothorax or pleural effusion is noted. The visualized skeletal structures are unremarkable. IMPRESSION: Interval development of multiple bilateral patchy airspace opacities is noted most consistent with multifocal pneumonia.  Electronically Signed   By: Marijo Conception M.D.   On: 02/20/2019 15:02    Procedures .Critical Care Performed by: Hayden Rasmussen, MD Authorized by: Hayden Rasmussen, MD   Critical care provider statement:    Critical care time (minutes):  45   Critical care time was exclusive of:  Separately billable procedures and treating other patients   Critical care was necessary to treat or prevent imminent or life-threatening deterioration of the following conditions:  Respiratory failure   Critical care was time spent personally by me on the following activities:  Evaluation of patient's response to treatment, examination of patient, ordering and performing treatments and interventions, ordering and review of laboratory studies, ordering and review of radiographic studies, pulse oximetry, re-evaluation of patient's condition, obtaining history from patient or surrogate, review of old charts and development of treatment plan with patient or surrogate   I assumed direction of critical care for this patient from another provider in my specialty: no     (including critical care time)  Medications Ordered in ED Medications  acetaminophen (TYLENOL) tablet 650 mg (has no administration in time range)     Initial Impression / Assessment and Plan / ED Course  I have reviewed the triage vital signs and the  nursing notes.  Pertinent labs & imaging results that were available during my care of the patient were reviewed by me and considered in my medical decision making (see chart for details).  Clinical Course as of Feb 19 1654  Tue Jul 14, 11100  5755 43 year old healthy male known Covid positive here with increased shortness of breath dizziness cough with a fever to 103 and requiring oxygen 4L [place].  Tachycardic and tachypneic.  Mental status normal.  Differential diagnosis includes Covid pneumonia, bacterial pneumonia, CHF, PE.    [MB]  7902 CXR reviewed by me, looks like multifocal pneumonia. Awaiting rads reading.    [MB]    Clinical Course User Index [MB] Hayden Rasmussen, MD   Tobey Grim was evaluated in Emergency Department on 02/20/2019 for the symptoms described in the history of present illness. He was evaluated in the context of the global COVID-19 pandemic, which necessitated consideration that the patient might be at risk for infection with the SARS-CoV-2 virus that causes COVID-19. Institutional protocols and algorithms that pertain to the evaluation of patients at risk for COVID-19 are in a state of rapid change based on information released by regulatory bodies including the CDC and federal and state organizations. These policies and algorithms were followed during the patient's care in the ED.      Final Clinical Impressions(s) / ED Diagnoses   Final diagnoses:  Acute respiratory disease due to COVID-19 virus  Hypoxia    ED Discharge Orders    None       Hayden Rasmussen, MD 02/20/19 1656

## 2019-02-21 ENCOUNTER — Other Ambulatory Visit: Payer: Self-pay

## 2019-02-21 DIAGNOSIS — E875 Hyperkalemia: Secondary | ICD-10-CM

## 2019-02-21 LAB — PHOSPHORUS: Phosphorus: 2 mg/dL — ABNORMAL LOW (ref 2.5–4.6)

## 2019-02-21 LAB — CBC
HCT: 52.1 % — ABNORMAL HIGH (ref 39.0–52.0)
Hemoglobin: 15.3 g/dL (ref 13.0–17.0)
MCH: 22 pg — ABNORMAL LOW (ref 26.0–34.0)
MCHC: 29.4 g/dL — ABNORMAL LOW (ref 30.0–36.0)
MCV: 74.9 fL — ABNORMAL LOW (ref 80.0–100.0)
Platelets: 195 10*3/uL (ref 150–400)
RBC: 6.96 MIL/uL — ABNORMAL HIGH (ref 4.22–5.81)
RDW: 15.6 % — ABNORMAL HIGH (ref 11.5–15.5)
WBC: 5.8 10*3/uL (ref 4.0–10.5)
nRBC: 0 % (ref 0.0–0.2)

## 2019-02-21 LAB — LACTATE DEHYDROGENASE: LDH: 610 U/L — ABNORMAL HIGH (ref 98–192)

## 2019-02-21 LAB — D-DIMER, QUANTITATIVE: D-Dimer, Quant: 2.06 ug/mL-FEU — ABNORMAL HIGH (ref 0.00–0.50)

## 2019-02-21 LAB — HIV ANTIBODY (ROUTINE TESTING W REFLEX)
HIV Screen 4th Generation wRfx: NONREACTIVE
HIV Screen 4th Generation wRfx: NONREACTIVE

## 2019-02-21 LAB — COMPREHENSIVE METABOLIC PANEL
ALT: 61 U/L — ABNORMAL HIGH (ref 0–44)
AST: 151 U/L — ABNORMAL HIGH (ref 15–41)
Albumin: 3.2 g/dL — ABNORMAL LOW (ref 3.5–5.0)
Alkaline Phosphatase: 76 U/L (ref 38–126)
Anion gap: 9 (ref 5–15)
BUN: 19 mg/dL (ref 6–20)
CO2: 23 mmol/L (ref 22–32)
Calcium: 11.5 mg/dL — ABNORMAL HIGH (ref 8.9–10.3)
Chloride: 104 mmol/L (ref 98–111)
Creatinine, Ser: 1.18 mg/dL (ref 0.61–1.24)
GFR calc Af Amer: >60 mL/min (ref >60)
GFR calc non Af Amer: >60 mL/min (ref >60)
Glucose, Bld: 120 mg/dL — ABNORMAL HIGH (ref 70–99)
Potassium: 5.2 mmol/L — ABNORMAL HIGH (ref 3.5–5.1)
Sodium: 136 mmol/L (ref 135–145)
Total Bilirubin: 0.7 mg/dL (ref 0.3–1.2)
Total Protein: 7.3 g/dL (ref 6.5–8.1)

## 2019-02-21 LAB — CK: Total CK: 6437 U/L — ABNORMAL HIGH (ref 49–397)

## 2019-02-21 LAB — ABO/RH: ABO/RH(D): B POS

## 2019-02-21 LAB — TRIGLYCERIDES: Triglycerides: 100 mg/dL (ref <150)

## 2019-02-21 LAB — FERRITIN: Ferritin: 1465 ng/mL — ABNORMAL HIGH (ref 24–336)

## 2019-02-21 LAB — MAGNESIUM: Magnesium: 1.7 mg/dL (ref 1.7–2.4)

## 2019-02-21 LAB — PROCALCITONIN: Procalcitonin: 1.21 ng/mL

## 2019-02-21 LAB — C-REACTIVE PROTEIN: CRP: 19.6 mg/dL — ABNORMAL HIGH (ref <1.0)

## 2019-02-21 MED ORDER — DEXAMETHASONE SODIUM PHOSPHATE 10 MG/ML IJ SOLN
6.0000 mg | Freq: Every day | INTRAMUSCULAR | Status: DC
  Administered 2019-02-21 – 2019-02-26 (×6): 6 mg via INTRAVENOUS
  Filled 2019-02-21 (×6): qty 1

## 2019-02-21 MED ORDER — TOCILIZUMAB 400 MG/20ML IV SOLN
800.0000 mg | Freq: Once | INTRAVENOUS | Status: AC
  Administered 2019-02-21: 800 mg via INTRAVENOUS
  Filled 2019-02-21: qty 40

## 2019-02-21 MED ORDER — LOPERAMIDE HCL 2 MG PO CAPS
2.0000 mg | ORAL_CAPSULE | ORAL | Status: DC | PRN
  Administered 2019-02-21 – 2019-02-22 (×2): 2 mg via ORAL
  Filled 2019-02-21 (×2): qty 1

## 2019-02-21 MED ORDER — MENTHOL 3 MG MT LOZG
1.0000 | LOZENGE | OROMUCOSAL | Status: DC | PRN
  Administered 2019-02-21 (×2): 3 mg via ORAL
  Filled 2019-02-21 (×2): qty 9

## 2019-02-21 NOTE — Progress Notes (Signed)
Dr. Loleta Books at bedside to evaluate pt. Updated of pt condition. Clarified need for another Covid-19 Swab, and order for NPO. New orders received. Per MD, cancel pending collection of COVID swab and start regular diet at this time.

## 2019-02-21 NOTE — Progress Notes (Signed)
Pt c/o diarrhea. Dr. Loleta Books notified via secure messaging. Awaiting response at this time.

## 2019-02-21 NOTE — Progress Notes (Signed)
PROGRESS NOTE    George Gibson  SWN:[id number] DOB: Feb 08, 1976 DOA: 02/20/2019 PCP: Patient, No Pcp Per      Brief Narrative:  Ms Ragas is a 43 y.o. F with morbid obesity, asthma who presented with few days progressive cough, SOB, feve and weakness.  Hypoxic to 80s and CXR with multifocal pneumonia.     Assessment & Plan:  Coronavirus pneumonitis with acute hypoxic respiratory failure In setting of ongoing 2020 COVID-19 pandemic.  Remdesivir start 7/14  Up to 5L O2 this morning He believes he has had TB in the past, but remembers no prolonged course of antibiotics.  CXR without calcified granulomas or scarring.  No hisotry of hepatitis.  -Defer Actemra for now; will order Quant gold and discuss with ID -Will discuss monoclonal Ab trials with CCM today -Continue remdesivir day 2 of 5 -Continue Steroids, day 2 of 10  -VTE PPx with Lovenox -Continue Zinc and Vitamin C -Daily d-dimer, ferritin and CRP  COVID-19 Labs Recent Labs    02/20/19 1350 02/21/19 0300  DDIMER 0.81* 2.06*  FERRITIN 1,346* 1,465*  LDH 550* 610*  CRP 15.0* 19.6*      Obesity Hyperlipidemia -Continue atorvastatin  Asthma No wheezing, he thinks he does not have asthma. -Continue home anti-histamine, nasal steroid -Doesn't take Dulera at home  Hyperkalemia K 5.2 today.  -Repeat K tomorrow       MDM and disposition: The below labs and imaging reports were reviewed and summarized above.  Medication management as above.  The patient was admitted with acute hypoxic respiratory failure from severe COVID-19.  His O2 saturations are worsening.  We will continue steroids and remdesivir and explore investigational therapies.  The limited data supporting therapeutics in [place] was discussed, potential risks and benefits of Actemra was discussed.     This is a severe illness with threat to life.       DVT prophylaxis: Lovenox Code Status: FULL Family Communication: None    Consultants:    None  Procedures:   None  Antimicrobials:   Ceftriaxone 7/14 x1  Azithromycin 7/14 x1   Culture data:   7/14 blood culture x3 -- NGTD       Subjective: Dizzy with standing or exertion.  Cough severe.  Diarrhea severe.  No wheezing, chest pain.  No sputum, hemoptysis.  No confusions.        Objective: Vitals:   02/21/19 0300 02/21/19 0354 02/21/19 0400 02/21/19 0800  BP:  90/71 109/88 95/62  Pulse: (!) 101  (!) 107 (!) 108  Resp:  (!) 34 (!) 23 (!) 32  Temp:    98.6 F (37 C)  TempSrc:    Oral  SpO2: 93%  91% 92%  Weight:      Height:        Intake/Output Summary (Last 24 hours) at 02/21/2019 1345 Last data filed at 02/21/2019 1100 Gross per 24 hour  Intake 240 ml  Output 800 ml  Net -560 ml   Filed Weights   02/20/19 1334  Weight: 127 kg    Examination: General appearance: obese adult male, alert and in mild respiratory distress.   HEENT: Anicteric, conjunctiva pink, lids and lashes normal. No nasal deformity, discharge, epistaxis.  Lips moist, teeth normal, OP moist, no oral lesions.   Skin: Warm and dry.  No jaundice.  No suspicious rashes or lesions. Cardiac: Tachycardic, regular, nl S1-S2, no murmurs appreciated.  Capillary refill is brisk.  JVP not visible.  No LE edema.  Radial pulses 2+ and symmetric. Respiratory: Tachypneic, no flaring or grunting.  CTAB without rales or wheezes. Abdomen: Abdomen soft.  No TTP or gaurding. No ascites, distension, hepatosplenomegaly.   MSK: No deformities or effusions. Neuro: Awake and alert.  EOMI, moves all extremities. Speech fluent.    Psych: Sensorium intact and responding to questions, attention normal. Affect normal.  Judgment and insight appear normal.       Data Reviewed: I have personally reviewed following labs and imaging studies:  CBC: Recent Labs  Lab 02/17/19 0309 02/20/19 1350 02/21/19 0300  WBC 5.2 6.7 5.8  NEUTROABS 3.9 5.4  --   HGB 15.0 15.5 15.3  HCT 50.4 52.8* 52.1*  MCV  75.0* 74.2* 74.9*  PLT 128* 190 195   Basic Metabolic Panel: Recent Labs  Lab 02/17/19 0309 02/20/19 1350 02/21/19 0300  NA 135 133* 136  K 3.9 4.4 5.2*  CL 107 100 104  CO2 20* 22 23  GLUCOSE 123* 91 120*  BUN 17 17 19   CREATININE 0.92 1.40* 1.18  CALCIUM 10.5* 10.8* 11.5*  MG  --   --  1.7  PHOS  --   --  2.0*   GFR: Estimated Creatinine Clearance: 103.7 mL/min (by C-G formula based on SCr of 1.18 mg/dL). Liver Function Tests: Recent Labs  Lab 02/17/19 0309 02/20/19 1350 02/21/19 0300  AST 49* 114* 151*  ALT 66* 59* 61*  ALKPHOS 102 80 76  BILITOT 0.7 0.8 0.7  PROT 6.8 7.5 7.3  ALBUMIN 3.6 3.6 3.2*   No results for input(s): LIPASE, AMYLASE in the last 168 hours. No results for input(s): AMMONIA in the last 168 hours. Coagulation Profile: No results for input(s): INR, PROTIME in the last 168 hours. Cardiac Enzymes: Recent Labs  Lab 02/21/19 0300  CKTOTAL 6,437*   BNP (last 3 results) No results for input(s): PROBNP in the last 8760 hours. HbA1C: No results for input(s): HGBA1C in the last 72 hours. CBG: No results for input(s): GLUCAP in the last 168 hours. Lipid Profile: Recent Labs    02/20/19 1350 02/21/19 0300  TRIG 94 100   Thyroid Function Tests: No results for input(s): TSH, T4TOTAL, FREET4, T3FREE, THYROIDAB in the last 72 hours. Anemia Panel: Recent Labs    02/20/19 1350 02/21/19 0300  FERRITIN 1,346* 1,465*   Urine analysis: No results found for: COLORURINE, APPEARANCEUR, LABSPEC, PHURINE, GLUCOSEU, HGBUR, BILIRUBINUR, KETONESUR, PROTEINUR, UROBILINOGEN, NITRITE, LEUKOCYTESUR Sepsis Labs: @LABRCNTIP (procalcitonin:4,lacticacidven:4)  ) Recent Results (from the past 240 hour(s))  SARS Coronavirus 2 [facility](Hospital order, Performed in [facility] hospital lab)     Status: Abnormal   Collection Time: 02/17/19  3:09 AM   Specimen: Nasopharyngeal Swab  Result Value Ref Range Status   SARS Coronavirus 2 POSITIVE (A) NEGATIVE Final     Comment: RESULT CALLED TO, READ BACK BY AND VERIFIED WITH: T DOSTER,RN 02/17/19 0530 RHOLMES (NOTE) If result is NEGATIVE SARS-CoV-2 target nucleic acids are NOT DETECTED. The SARS-CoV-2 RNA is generally detectable in upper and lower  respiratory specimens during the acute phase of infection. The lowest  concentration of SARS-CoV-2 viral copies this assay can detect is 250  copies / mL. A negative result does not preclude SARS-CoV-2 infection  and should not be used as the sole basis for treatment or other  patient management decisions.  A negative result may occur with  improper specimen collection / handling, submission of specimen other  than nasopharyngeal swab, presence of viral mutation(s) within the  areas targeted by this  assay, and inadequate number of viral copies  (<250 copies / mL). A negative result must be combined with clinical  observations, patient history, and epidemiological information. If result is POSITIVE SARS-CoV-2 target nucleic acids are DETECTED. The  SARS-CoV-2 RNA is generally detectable in upper and lower  respiratory specimens during the acute phase of infection.  Positive  results are indicative of active infection with SARS-CoV-2.  Clinical  correlation with patient history and other diagnostic information is  necessary to determine patient infection status.  Positive results do  not rule out bacterial infection or co-infection with other viruses. If result is PRESUMPTIVE POSTIVE SARS-CoV-2 nucleic acids MAY BE PRESENT.   A presumptive positive result was obtained on the submitted specimen  and confirmed on repeat testing.  While 2019 novel coronavirus  (SARS-CoV-2) nucleic acids may be present in the submitted sample  additional confirmatory testing may be necessary for epidemiological  and / or clinical management purposes  to differentiate between  SARS-CoV-2 and other Sarbecovirus currently known to infect humans.  If clinically indicated  additional testing with an alternate test  methodology [phone]([id number]) is ad vised. The SARS-CoV-2 RNA is generally  detectable in upper and lower respiratory specimens during the acute  phase of infection. The expected result is Negative. Fact Sheet for Patients:  BoilerBrush.com.cyhttps://www.fda.gov/media/136312/download Fact Sheet for Healthcare Providers: https://pope.com/https://www.fda.gov/media/136313/download This test is not yet approved or cleared by the [place] FDA and has been authorized for detection and/or diagnosis of SARS-CoV-2 by FDA under an Emergency Use Authorization (EUA).  This EUA will remain in effect (meaning this test can be used) for the duration of the COVID-19 declaration under Section 564(b)(1) of the Act, 21 U.S.C. section 360bbb-3(b)(1), unless the authorization is terminated or revoked sooner. Performed at [facility], [address] [place], [place] [address]   Blood Culture (routine x 2)     Status: None (Preliminary result)   Collection Time: 02/17/19  3:09 AM   Specimen: BLOOD LEFT HAND  Result Value Ref Range Status   Specimen Description   Final    BLOOD LEFT HAND Performed at [facility], [address] [place], [place] [address]    Special Requests   Final    BOTTLES DRAWN AEROBIC AND ANAEROBIC Blood Culture adequate volume Performed at [facility], [address] [place], [place] [address]    Culture   Final    NO GROWTH 3 DAYS Performed at [facility], [address] [place], [place] [address]    Report Status PENDING  Incomplete  Blood Culture (routine x 2)     Status: None (Preliminary result)   Collection Time: 02/17/19  3:14 AM   Specimen: BLOOD  Result Value Ref Range Status   Specimen Description   Final    BLOOD RIGHT ANTECUBITAL Performed at [facility], [address] [place], [place] [address]    Special Requests   Final    BOTTLES DRAWN AEROBIC AND ANAEROBIC  Blood Culture adequate volume Performed at [facility], [address] [place], [place] [address]    Culture   Final    NO GROWTH 3 DAYS Performed at [facility], [address] [place], [place] [address]    Report Status PENDING  Incomplete         Radiology Studies: Dg Chest Port 1 View  Result Date: 02/20/2019 CLINICAL DATA:  Cough, shortness of breath. EXAM: PORTABLE CHEST 1 VIEW  COMPARISON:  Radiograph February 17, 2019. FINDINGS: Stable cardiomediastinal silhouette. Interval development of multiple patchy airspace opacities throughout both lungs. No pneumothorax or pleural effusion is noted. The visualized skeletal structures are unremarkable. IMPRESSION: Interval development of multiple bilateral patchy airspace opacities is noted most consistent with multifocal pneumonia. Electronically Signed   By: Lupita RaiderJames  Green Jr M.D.   On: 02/20/2019 15:02        Scheduled Meds: . atorvastatin  20 mg Oral Daily  . dexamethasone (DECADRON) injection  6 mg Intravenous Daily  . enoxaparin (LOVENOX) injection  60 mg Subcutaneous Q24H  . fluticasone  2 spray Each Nare Daily  . loratadine  10 mg Oral Daily  . vitamin C  500 mg Oral Daily  . zinc sulfate  220 mg Oral Daily   Continuous Infusions: . remdesivir 100 mg in NS 250 mL       LOS: 1 day    Time spent: 35 minutes      Alberteen Samhristopher P Jonai Weyland, MD [facility] 02/21/2019, 1:45 PM     Please page through AMION:  [email or web address] Password [email or web address] If 7PM-7AM, please contact night-coverage

## 2019-02-21 NOTE — Progress Notes (Signed)
Revisited possible TB history.  Patient is unsure if he ever had TB.  He has a vague memory of being told he had "tested positive for TB", but when pressed, he is sure he never was treated for 6 months with antibiotics, and is unsure if he was really told he had possible TB.  He emigrated from [place] at age 43. He has no known family members with history of TB.  He had no symptoms of active TB prior to admission, and his current presentation is not thought to be active TB.  Regarding off-label use of Actemra: This patient has confirmed COVID-19 in the setting of the ongoing 2020 coronavirus pandemic.  He has hypoxia and is high-risk for intubation (due to age, BMI >35, CRP > 14 mg/dL), but expected to survive >48 hours and has good baseline functional status.  He is not on immunomodulators, anti-rejection medications, or cancer chemotherapy, has no known history of TB or latent TB, and no history of diverticulitis or intestinal perforation.  Platelets are >50K, ANC is >500, and ALT/AST are below 5x ULN with no known hepatitis B infection. The investigational nature of this medication was discussed with the patient/HCPOA and they choose to proceed as the potential benefits are felt to outweigh risks at this time.  -Tocilizumab 800 mg now -Monitor for infusion reaction -Daily LFTs

## 2019-02-22 LAB — COMPREHENSIVE METABOLIC PANEL
ALT: 64 U/L — ABNORMAL HIGH (ref 0–44)
AST: 126 U/L — ABNORMAL HIGH (ref 15–41)
Albumin: 3.1 g/dL — ABNORMAL LOW (ref 3.5–5.0)
Alkaline Phosphatase: 70 U/L (ref 38–126)
Anion gap: 9 (ref 5–15)
BUN: 25 mg/dL — ABNORMAL HIGH (ref 6–20)
CO2: 23 mmol/L (ref 22–32)
Calcium: 11.4 mg/dL — ABNORMAL HIGH (ref 8.9–10.3)
Chloride: 106 mmol/L (ref 98–111)
Creatinine, Ser: 0.95 mg/dL (ref 0.61–1.24)
GFR calc Af Amer: >60 mL/min (ref >60)
GFR calc non Af Amer: >60 mL/min (ref >60)
Glucose, Bld: 123 mg/dL — ABNORMAL HIGH (ref 70–99)
Potassium: 4.8 mmol/L (ref 3.5–5.1)
Sodium: 138 mmol/L (ref 135–145)
Total Bilirubin: 0.5 mg/dL (ref 0.3–1.2)
Total Protein: 6.9 g/dL (ref 6.5–8.1)

## 2019-02-22 LAB — C-REACTIVE PROTEIN: CRP: 8.1 mg/dL — ABNORMAL HIGH (ref <1.0)

## 2019-02-22 LAB — CULTURE, BLOOD (ROUTINE X 2)
Culture: NO GROWTH
Culture: NO GROWTH
Special Requests: ADEQUATE
Special Requests: ADEQUATE

## 2019-02-22 LAB — CBC
HCT: 50.8 % (ref 39.0–52.0)
Hemoglobin: 15.1 g/dL (ref 13.0–17.0)
MCH: 21.9 pg — ABNORMAL LOW (ref 26.0–34.0)
MCHC: 29.7 g/dL — ABNORMAL LOW (ref 30.0–36.0)
MCV: 73.8 fL — ABNORMAL LOW (ref 80.0–100.0)
Platelets: 269 10*3/uL (ref 150–400)
RBC: 6.88 MIL/uL — ABNORMAL HIGH (ref 4.22–5.81)
RDW: 16.4 % — ABNORMAL HIGH (ref 11.5–15.5)
WBC: 8 10*3/uL (ref 4.0–10.5)
nRBC: 0 % (ref 0.0–0.2)

## 2019-02-22 LAB — D-DIMER, QUANTITATIVE: D-Dimer, Quant: 1.65 ug/mL-FEU — ABNORMAL HIGH (ref 0.00–0.50)

## 2019-02-22 LAB — MAGNESIUM: Magnesium: 1.9 mg/dL (ref 1.7–2.4)

## 2019-02-22 LAB — INTERLEUKIN-6, PLASMA: Interleukin-6, Plasma: 22.2 pg/mL — ABNORMAL HIGH (ref 0.0–12.2)

## 2019-02-22 LAB — FERRITIN: Ferritin: 1392 ng/mL — ABNORMAL HIGH (ref 24–336)

## 2019-02-22 MED ORDER — ALUM & MAG HYDROXIDE-SIMETH 200-200-20 MG/5ML PO SUSP
30.0000 mL | Freq: Four times a day (QID) | ORAL | Status: DC | PRN
  Administered 2019-02-22 – 2019-02-23 (×4): 30 mL via ORAL
  Filled 2019-02-22 (×4): qty 30

## 2019-02-22 NOTE — Progress Notes (Signed)
PROGRESS NOTE    George Gibson  ZOX:[id number] DOB: 01/21/1976 DOA: 02/20/2019 PCP: Patient, No Pcp Per      Brief Narrative:  George Gibson is a 43 y.o. F with morbid obesity, asthma who presented with few days progressive cough, SOB, feve and weakness.  Hypoxic to 80s and CXR with multifocal pneumonia.     Assessment & Plan:  Coronavirus pneumonitis with acute hypoxic respiratory failure In setting of ongoing 2020 COVID-19 pandemic.  Remdesivir start 7/14 S/p Actemra 7/15 No other trials available at this time.  Down to 6L today.  LFTs stable.  -Continue remdesivir day 3 of 5 -Continue Steroids, day 3 of 10  -Continue VTE PPx with Lovenox -Continue Zinc and Vitamin C     Obesity Hyperlipidemia -Continue atorvastatin  Asthma No wheezing, he thinks he does not have asthma. -Continue home anti-histamine, nasal steroid  Hyperkalemia k normal -Repeat K tomorrow       MDM and disposition: The below labs and imaging reports were reviewed and summarized above.  Medication management as above.  The patient was admitted with acute hypoxic respiratory failure from severe COVID-19, this is a severe illness with threat to life.  His O2 saturations continue to worsen, we have started Actemra and steroids, will continue current cares.  Likely several more days admission in place.     DVT prophylaxis: Lovenox Code Status: FULL Family Communication: None    Consultants:   None  Procedures:   None  Antimicrobials:   Ceftriaxone 7/14 x1  Azithromycin 7/14 x1   Culture data:   7/14 blood culture x3 -- NGTD       Subjective: Less dizzy with exertion today.  Cough still persist but not as bad.  Still with diarrhea.  No wheezing, chest pain, sputum, confusion.  A little bit of hemoptysis this morning.         Objective: Vitals:   02/21/19 2346 02/22/19 0405 02/22/19 0419 02/22/19 0829  BP: 98/73 104/70  99/78  Pulse: 97 73 99 96  Resp: (!) 24  (!) 28 20 19   Temp: 98.6 F (37 C) 98.1 F (36.7 C)  97.6 F (36.4 C)  TempSrc: Oral   Oral  SpO2: 92% (!) 88% 90% 94%  Weight:      Height:        Intake/Output Summary (Last 24 hours) at 02/22/2019 1715 Last data filed at 02/22/2019 1418 Gross per 24 hour  Intake 1335.17 ml  Output 1300 ml  Net 35.17 ml   Filed Weights   02/20/19 1334  Weight: 127 kg    Examination: General appearance: Obese adult male, sitting in the edge of the bed, eating breakfast HEENT: Anicteric, conjunctiva pink, lids and lashes normal. No nasal deformity, discharge, epistaxis.  Lips moist, teeth normal, OP moist, no oral lesions.   Skin: Warm and dry.  No jaundice.  No suspicious rashes or lesions. Cardiac: Tachycardic, regular, no murmurs, JVP not visible.  No lower extremity edema. Respiratory: Tachypneic, shallow, no accessory muscle use.  Lung sounds diminished, without rales or wheezes appreciated. Abdomen: Abdomen soft without tenderness to palpation, guarding, ascites, distention, or hepatosplenomegaly. MSK: No deformities or effusions. Neuro: Awake and alert, extraocular movements intact, moves all extremities, speech fluent. Psych: Sensorium intact responding to questions, attention normal, affect normal, judgment insight appear normal.       Data Reviewed: I have personally reviewed following labs and imaging studies:  CBC: Recent Labs  Lab 02/17/19 0309 02/20/19 1350 02/21/19 0300  02/22/19 0245  WBC 5.2 6.7 5.8 8.0  NEUTROABS 3.9 5.4  --   --   HGB 15.0 15.5 15.3 15.1  HCT 50.4 52.8* 52.1* 50.8  MCV 75.0* 74.2* 74.9* 73.8*  PLT 128* 190 195 269   Basic Metabolic Panel: Recent Labs  Lab 02/17/19 0309 02/20/19 1350 02/21/19 0300 02/22/19 0245  NA 135 133* 136 138  K 3.9 4.4 5.2* 4.8  CL 107 100 104 106  CO2 20* 22 23 23   GLUCOSE 123* 91 120* 123*  BUN 17 17 19  25*  CREATININE 0.92 1.40* 1.18 0.95  CALCIUM 10.5* 10.8* 11.5* 11.4*  MG  --   --  1.7 1.9  PHOS  --    --  2.0*  --    GFR: Estimated Creatinine Clearance: 128.8 mL/min (by C-G formula based on SCr of 0.95 mg/dL). Liver Function Tests: Recent Labs  Lab 02/17/19 0309 02/20/19 1350 02/21/19 0300 02/22/19 0245  AST 49* 114* 151* 126*  ALT 66* 59* 61* 64*  ALKPHOS 102 80 76 70  BILITOT 0.7 0.8 0.7 0.5  PROT 6.8 7.5 7.3 6.9  ALBUMIN 3.6 3.6 3.2* 3.1*   No results for input(s): LIPASE, AMYLASE in the last 168 hours. No results for input(s): AMMONIA in the last 168 hours. Coagulation Profile: No results for input(s): INR, PROTIME in the last 168 hours. Cardiac Enzymes: Recent Labs  Lab 02/21/19 0300  CKTOTAL 6,437*   BNP (last 3 results) No results for input(s): PROBNP in the last 8760 hours. HbA1C: No results for input(s): HGBA1C in the last 72 hours. CBG: No results for input(s): GLUCAP in the last 168 hours. Lipid Profile: Recent Labs    02/20/19 1350 02/21/19 0300  TRIG 94 100   Thyroid Function Tests: No results for input(s): TSH, T4TOTAL, FREET4, T3FREE, THYROIDAB in the last 72 hours. Anemia Panel: Recent Labs    02/21/19 0300 02/22/19 0230  FERRITIN 1,465* 1,392*   Urine analysis: No results found for: COLORURINE, APPEARANCEUR, LABSPEC, PHURINE, GLUCOSEU, HGBUR, BILIRUBINUR, KETONESUR, PROTEINUR, UROBILINOGEN, NITRITE, LEUKOCYTESUR Sepsis Labs: @LABRCNTIP (procalcitonin:4,lacticacidven:4)  ) Recent Results (from the past 240 hour(s))  SARS Coronavirus 2 [facility](Hospital order, Performed in [facility] hospital lab)     Status: Abnormal   Collection Time: 02/17/19  3:09 AM   Specimen: Nasopharyngeal Swab  Result Value Ref Range Status   SARS Coronavirus 2 POSITIVE (A) NEGATIVE Final    Comment: RESULT CALLED TO, READ BACK BY AND VERIFIED WITH: T DOSTER,RN 02/17/19 0530 RHOLMES (NOTE) If result is NEGATIVE SARS-CoV-2 target nucleic acids are NOT DETECTED. The SARS-CoV-2 RNA is generally detectable in upper and lower  respiratory specimens during the acute  phase of infection. The lowest  concentration of SARS-CoV-2 viral copies this assay can detect is 250  copies / mL. A negative result does not preclude SARS-CoV-2 infection  and should not be used as the sole basis for treatment or other  patient management decisions.  A negative result may occur with  improper specimen collection / handling, submission of specimen other  than nasopharyngeal swab, presence of viral mutation(s) within the  areas targeted by this assay, and inadequate number of viral copies  (<250 copies / mL). A negative result must be combined with clinical  observations, patient history, and epidemiological information. If result is POSITIVE SARS-CoV-2 target nucleic acids are DETECTED. The  SARS-CoV-2 RNA is generally detectable in upper and lower  respiratory specimens during the acute phase of infection.  Positive  results are indicative of  active infection with SARS-CoV-2.  Clinical  correlation with patient history and other diagnostic information is  necessary to determine patient infection status.  Positive results do  not rule out bacterial infection or co-infection with other viruses. If result is PRESUMPTIVE POSTIVE SARS-CoV-2 nucleic acids MAY BE PRESENT.   A presumptive positive result was obtained on the submitted specimen  and confirmed on repeat testing.  While 2019 novel coronavirus  (SARS-CoV-2) nucleic acids may be present in the submitted sample  additional confirmatory testing may be necessary for epidemiological  and / or clinical management purposes  to differentiate between  SARS-CoV-2 and other Sarbecovirus currently known to infect humans.  If clinically indicated additional testing with an alternate test  methodology [phone] is ad vised. The SARS-CoV-2 RNA is generally  detectable in upper and lower respiratory specimens during the acute  phase of infection. The expected result is Negative. Fact Sheet for Patients:   BoilerBrush.com.cyhttps://www.fda.gov/media/136312/download Fact Sheet for Healthcare Providers: https://pope.com/https://www.fda.gov/media/136313/download This test is not yet approved or cleared by the [place] FDA and has been authorized for detection and/or diagnosis of SARS-CoV-2 by FDA under an Emergency Use Authorization (EUA).  This EUA will remain in effect (meaning this test can be used) for the duration of the COVID-19 declaration under Section 564(b)(1) of the Act, 21 U.S.C. section 360bbb-3(b)(1), unless the authorization is terminated or revoked sooner. Performed at [facility], [address] [place], [place] [address]   Blood Culture (routine x 2)     Status: None   Collection Time: 02/17/19  3:09 AM   Specimen: BLOOD LEFT HAND  Result Value Ref Range Status   Specimen Description   Final    BLOOD LEFT HAND Performed at [facility], [address] [place], [place] [address]    Special Requests   Final    BOTTLES DRAWN AEROBIC AND ANAEROBIC Blood Culture adequate volume Performed at [facility], [address] [place], [place] [address]    Culture   Final    NO GROWTH 5 DAYS Performed at [facility], [address] [place], [place] [address]    Report Status 02/22/2019 FINAL  Final  Blood Culture (routine x 2)     Status: None   Collection Time: 02/17/19  3:14 AM   Specimen: BLOOD  Result Value Ref Range Status   Specimen Description   Final    BLOOD RIGHT ANTECUBITAL Performed at [facility], [address] [place], [place] [address]    Special Requests   Final    BOTTLES DRAWN AEROBIC AND ANAEROBIC Blood Culture adequate volume Performed at [facility], [address] [place], [place] [address]    Culture   Final    NO GROWTH 5 DAYS Performed at [facility], [address] [place], [place] [address]    Report Status 02/22/2019 FINAL  Final  Blood Culture (routine  x 2)     Status: None (Preliminary result)   Collection Time: 02/20/19  1:42 PM   Specimen: BLOOD LEFT HAND  Result Value Ref Range Status   Specimen Description   Final    BLOOD LEFT HAND Performed at [facility], [address] [place], [place] [address]    Special Requests   Final    BOTTLES DRAWN AEROBIC AND ANAEROBIC Blood Culture adequate volume Performed at [facility], [address] [place], [place] [address]    Culture  Final    NO GROWTH 2 DAYS Performed at [facility], [place] [address], [place], [place] [address]    Report Status PENDING  Incomplete  Blood Culture (routine x 2)     Status: None (Preliminary result)   Collection Time: 02/20/19  1:50 PM   Specimen: BLOOD  Result Value Ref Range Status   Specimen Description   Final    BLOOD RIGHT ANTECUBITAL Performed at [place] [address] [place], [place] [address]    Special Requests   Final    BOTTLES DRAWN AEROBIC ONLY Blood Culture adequate volume Performed at [place] [address], [place], [place] [address]    Culture   Final    NO GROWTH 2 DAYS Performed at [place] [address], [place], [place] [address]    Report Status PENDING  Incomplete         Radiology Studies: No results found.      Scheduled Meds: . atorvastatin  20 mg Oral Daily  . dexamethasone (DECADRON) injection  6 mg Intravenous Daily  . enoxaparin (LOVENOX) injection  60 mg Subcutaneous Q24H  . fluticasone  2 spray Each Nare Daily  . loratadine  10 mg Oral Daily  . vitamin C  500 mg Oral Daily  . zinc sulfate  220 mg Oral Daily   Continuous Infusions: . remdesivir 100 mg in NS 250 mL 100 mg (02/22/19 1658)     LOS: 2 days    Time spent: 35 minutes      Edwin Dada, MD [facility] 02/22/2019, 5:15 PM     Please page through AMION:  [email or web address] If 7PM-7AM,  please contact night-coverage

## 2019-02-23 LAB — D-DIMER, QUANTITATIVE: D-Dimer, Quant: 1.3 ug/mL-FEU — ABNORMAL HIGH (ref 0.00–0.50)

## 2019-02-23 LAB — COMPREHENSIVE METABOLIC PANEL
ALT: 72 U/L — ABNORMAL HIGH (ref 0–44)
AST: 97 U/L — ABNORMAL HIGH (ref 15–41)
Albumin: 3.2 g/dL — ABNORMAL LOW (ref 3.5–5.0)
Alkaline Phosphatase: 70 U/L (ref 38–126)
Anion gap: 9 (ref 5–15)
BUN: 25 mg/dL — ABNORMAL HIGH (ref 6–20)
CO2: 26 mmol/L (ref 22–32)
Calcium: 11.5 mg/dL — ABNORMAL HIGH (ref 8.9–10.3)
Chloride: 105 mmol/L (ref 98–111)
Creatinine, Ser: 0.88 mg/dL (ref 0.61–1.24)
GFR calc Af Amer: >60 mL/min (ref >60)
GFR calc non Af Amer: >60 mL/min (ref >60)
Glucose, Bld: 90 mg/dL (ref 70–99)
Potassium: 4.3 mmol/L (ref 3.5–5.1)
Sodium: 140 mmol/L (ref 135–145)
Total Bilirubin: 0.5 mg/dL (ref 0.3–1.2)
Total Protein: 6.9 g/dL (ref 6.5–8.1)

## 2019-02-23 LAB — CBC
HCT: 52.5 % — ABNORMAL HIGH (ref 39.0–52.0)
Hemoglobin: 15.1 g/dL (ref 13.0–17.0)
MCH: 21.4 pg — ABNORMAL LOW (ref 26.0–34.0)
MCHC: 28.8 g/dL — ABNORMAL LOW (ref 30.0–36.0)
MCV: 74.4 fL — ABNORMAL LOW (ref 80.0–100.0)
Platelets: 309 10*3/uL (ref 150–400)
RBC: 7.06 MIL/uL — ABNORMAL HIGH (ref 4.22–5.81)
RDW: 15.9 % — ABNORMAL HIGH (ref 11.5–15.5)
WBC: 8.1 10*3/uL (ref 4.0–10.5)
nRBC: 0 % (ref 0.0–0.2)

## 2019-02-23 LAB — QUANTIFERON-TB GOLD PLUS: QuantiFERON-TB Gold Plus: UNDETERMINED — AB

## 2019-02-23 LAB — QUANTIFERON-TB GOLD PLUS (RQFGPL)
QuantiFERON Mitogen Value: 0.12 IU/mL
QuantiFERON Nil Value: 0.02 IU/mL
QuantiFERON TB1 Ag Value: 0.02 IU/mL
QuantiFERON TB2 Ag Value: 0.02 IU/mL

## 2019-02-23 LAB — C-REACTIVE PROTEIN: CRP: 2.1 mg/dL — ABNORMAL HIGH (ref <1.0)

## 2019-02-23 LAB — MAGNESIUM: Magnesium: 2 mg/dL (ref 1.7–2.4)

## 2019-02-23 LAB — FERRITIN: Ferritin: 1234 ng/mL — ABNORMAL HIGH (ref 24–336)

## 2019-02-23 NOTE — Progress Notes (Signed)
PROGRESS NOTE    George Gibson  ZOX:[id number] DOB: 02/08/1976 DOA: 02/20/2019 PCP: Patient, No Pcp Per      Brief Narrative:  [name] is a 43 y.o. F with morbid obesity, asthma who presented with few days progressive cough, SOB, feve and weakness.  Hypoxic to 80s and CXR with multifocal pneumonia.     Assessment & Plan:  Coronavirus pneumonitis with acute hypoxic respiratory failure In setting of ongoing 2020 COVID-19 pandemic.  Remdesivir start 7/14 S/p Actemra 7/15 No other trials available at this time.  Quant gold still pending.  O2 needs weaned to 3.5L overnight from 6L yesterday.  LFTs no change.  -Continue remdesivir day 4 of 5 -Continue Steroids, day 4 of 10  -Continue VTE PPx with Lovenox -Continue Zinc and Vitamin C -Trend LFTs on remdesivir    Obesity Hyperlipidemia -Continue atorvastatin  Asthma No wheezing, he thinks he does not have asthma. -Continue home anti-histamine, nasal steroid  Hyperkalemia K stable       MDM and disposition: The below labs and imaging reports reviewed and summarized above.  Medication management as above.  The patient was admitted with acute hypoxic respiratory failure from severe COVID-19, severe illness with threat to life.  He remains on high levels of oxygen, we will continue steroids, remdesivir.  Likely to home in several days if he improves to more stable O2 level.    DVT prophylaxis: Lovenox Code Status: FULL Family Communication: None    Consultants:   None  Procedures:   None  Antimicrobials:   Ceftriaxone 7/14 x1  Azithromycin 7/14 x1   Culture data:   7/14 blood culture x3 -- NGTD       Subjective: Still with cough, occasional scant hemoptysis.  Out of breath with exertion.  Diarrhea resolving, sputum, chest pain, confusion not present.        Objective: Vitals:   02/22/19 2100 02/22/19 2130 02/23/19 0428 02/23/19 0807  BP:  95/60 98/61 98/78   Pulse: (!) 106 81  88   Resp: (!) 36   (!) 24  Temp:  98.5 F (36.9 C) 97.8 F (36.6 C) 97.6 F (36.4 C)  TempSrc:  Oral Oral Oral  SpO2: (!) 88% 93%  93%  Weight:      Height:        Intake/Output Summary (Last 24 hours) at 02/23/2019 1008 Last data filed at 02/22/2019 2148 Gross per 24 hour  Intake 600 ml  Output 200 ml  Net 400 ml   Filed Weights   02/20/19 1334  Weight: 127 kg    Examination: General appearance: Obese adult male, sitting on the edge of the bed, no acute distress HEENT: Anicteric, conjunctiva pink, lids and lashes normal. No nasal deformity, discharge, epistaxis.  Lips moist, teeth normal, OP moist, no oral lesions.   Skin: Warm and dry.  No jaundice.  No suspicious rashes or lesions. Cardiac: Rate and rhythm regular, no murmurs, JVP not visible, no lower extremity edema.   Respiratory: Respiratory effort increased, but no accessory muscle use.  No nasal flaring.  Lung sounds diminished bilaterally.  No wheezing. Abdomen: Abdomen soft without tenderness to palpation or guarding. MSK: No deformities or effusions. Neuro: Awake and alert, extraocular movements intact, moves all extremities, speech fluent. Psych: Sensorium intact responding to questions, attention normal, affect normal, judgment insight appear normal.       Data Reviewed: I have personally reviewed following labs and imaging studies:  CBC: Recent Labs  Lab 02/17/19 0309  02/20/19 1350 02/21/19 0300 02/22/19 0245 02/23/19 0500  WBC 5.2 6.7 5.8 8.0 8.1  NEUTROABS 3.9 5.4  --   --   --   HGB 15.0 15.5 15.3 15.1 15.1  HCT 50.4 52.8* 52.1* 50.8 52.5*  MCV 75.0* 74.2* 74.9* 73.8* 74.4*  PLT 128* 190 195 269 309   Basic Metabolic Panel: Recent Labs  Lab 02/17/19 0309 02/20/19 1350 02/21/19 0300 02/22/19 0245 02/23/19 0500  NA 135 133* 136 138 140  K 3.9 4.4 5.2* 4.8 4.3  CL 107 100 104 106 105  CO2 20* 22 23 23 26   GLUCOSE 123* 91 120* 123* 90  BUN 17 17 19  25* 25*  CREATININE 0.92 1.40* 1.18 0.95  0.88  CALCIUM 10.5* 10.8* 11.5* 11.4* 11.5*  MG  --   --  1.7 1.9 2.0  PHOS  --   --  2.0*  --   --    GFR: Estimated Creatinine Clearance: 139.1 mL/min (by C-G formula based on SCr of 0.88 mg/dL). Liver Function Tests: Recent Labs  Lab 02/17/19 0309 02/20/19 1350 02/21/19 0300 02/22/19 0245 02/23/19 0500  AST 49* 114* 151* 126* 97*  ALT 66* 59* 61* 64* 72*  ALKPHOS 102 80 76 70 70  BILITOT 0.7 0.8 0.7 0.5 0.5  PROT 6.8 7.5 7.3 6.9 6.9  ALBUMIN 3.6 3.6 3.2* 3.1* 3.2*   No results for input(s): LIPASE, AMYLASE in the last 168 hours. No results for input(s): AMMONIA in the last 168 hours. Coagulation Profile: No results for input(s): INR, PROTIME in the last 168 hours. Cardiac Enzymes: Recent Labs  Lab 02/21/19 0300  CKTOTAL 6,437*   BNP (last 3 results) No results for input(s): PROBNP in the last 8760 hours. HbA1C: No results for input(s): HGBA1C in the last 72 hours. CBG: No results for input(s): GLUCAP in the last 168 hours. Lipid Profile: Recent Labs    02/20/19 1350 02/21/19 0300  TRIG 94 100   Thyroid Function Tests: No results for input(s): TSH, T4TOTAL, FREET4, T3FREE, THYROIDAB in the last 72 hours. Anemia Panel: Recent Labs    02/22/19 0230 02/23/19 0500  FERRITIN 1,392* 1,234*   Urine analysis: No results found for: COLORURINE, APPEARANCEUR, LABSPEC, PHURINE, GLUCOSEU, HGBUR, BILIRUBINUR, KETONESUR, PROTEINUR, UROBILINOGEN, NITRITE, LEUKOCYTESUR Sepsis Labs: @LABRCNTIP (procalcitonin:4,lacticacidven:4)  ) Recent Results (from the past 240 hour(s))  SARS Coronavirus 2 [facility](Hospital order, Performed in [facility] lab)     Status: Abnormal   Collection Time: 02/17/19  3:09 AM   Specimen: Nasopharyngeal Swab  Result Value Ref Range Status   SARS Coronavirus 2 POSITIVE (A) NEGATIVE Final    Comment: RESULT CALLED TO, READ BACK BY AND VERIFIED WITH: T DOSTER,RN 02/17/19 0530 RHOLMES (NOTE) If result is NEGATIVE SARS-CoV-2 target nucleic  acids are NOT DETECTED. The SARS-CoV-2 RNA is generally detectable in upper and lower  respiratory specimens during the acute phase of infection. The lowest  concentration of SARS-CoV-2 viral copies this assay can detect is 250  copies / mL. A negative result does not preclude SARS-CoV-2 infection  and should not be used as the sole basis for treatment or other  patient management decisions.  A negative result may occur with  improper specimen collection / handling, submission of specimen other  than nasopharyngeal swab, presence of viral mutation(s) within the  areas targeted by this assay, and inadequate number of viral copies  (<250 copies / mL). A negative result must be combined with clinical  observations, patient history, and epidemiological information. If  result is POSITIVE SARS-CoV-2 target nucleic acids are DETECTED. The  SARS-CoV-2 RNA is generally detectable in upper and lower  respiratory specimens during the acute phase of infection.  Positive  results are indicative of active infection with SARS-CoV-2.  Clinical  correlation with patient history and other diagnostic information is  necessary to determine patient infection status.  Positive results do  not rule out bacterial infection or co-infection with other viruses. If result is PRESUMPTIVE POSTIVE SARS-CoV-2 nucleic acids MAY BE PRESENT.   A presumptive positive result was obtained on the submitted specimen  and confirmed on repeat testing.  While 2019 novel coronavirus  (SARS-CoV-2) nucleic acids may be present in the submitted sample  additional confirmatory testing may be necessary for epidemiological  and / or clinical management purposes  to differentiate between  SARS-CoV-2 and other Sarbecovirus currently known to infect humans.  If clinically indicated additional testing with an alternate test  methodology [phone]([id number]) is ad vised. The SARS-CoV-2 RNA is generally  detectable in upper and lower respiratory  specimens during the acute  phase of infection. The expected result is Negative. Fact Sheet for Patients:  BoilerBrush.com.cyhttps://www.fda.gov/media/136312/download Fact Sheet for Healthcare Providers: https://pope.com/https://www.fda.gov/media/136313/download This test is not yet approved or cleared by the [place] FDA and has been authorized for detection and/or diagnosis of SARS-CoV-2 by FDA under an Emergency Use Authorization (EUA).  This EUA will remain in effect (meaning this test can be used) for the duration of the COVID-19 declaration under Section 564(b)(1) of the Act, 21 U.S.C. section 360bbb-3(b)(1), unless the authorization is terminated or revoked sooner. Performed at [facility], [address] [place], [place] [address]   Blood Culture (routine x 2)     Status: None   Collection Time: 02/17/19  3:09 AM   Specimen: BLOOD LEFT HAND  Result Value Ref Range Status   Specimen Description   Final    BLOOD LEFT HAND Performed at [facility], [address] [place], [place] [address]    Special Requests   Final    BOTTLES DRAWN AEROBIC AND ANAEROBIC Blood Culture adequate volume Performed at [facility], [address] [place], [place] [address]    Culture   Final    NO GROWTH 5 DAYS Performed at [facility], [address] [place], [place] [address]    Report Status 02/22/2019 FINAL  Final  Blood Culture (routine x 2)     Status: None   Collection Time: 02/17/19  3:14 AM   Specimen: BLOOD  Result Value Ref Range Status   Specimen Description   Final    BLOOD RIGHT ANTECUBITAL Performed at [facility], [address] [place], [place] [address]    Special Requests   Final    BOTTLES DRAWN AEROBIC AND ANAEROBIC Blood Culture adequate volume Performed at [facility], [address] [place], [place] [address]    Culture   Final    NO GROWTH 5 DAYS Performed at [facility], [address] [place], [place] [address]    Report Status 02/22/2019 FINAL  Final  Blood Culture (routine x 2)     Status: None (Preliminary result)   Collection Time: 02/20/19  1:42 PM   Specimen: BLOOD LEFT HAND  Result Value Ref Range Status   Specimen Description   Final    BLOOD LEFT HAND Performed at [facility], [address] [place], [place] [address]  Special Requests   Final    BOTTLES DRAWN AEROBIC AND ANAEROBIC Blood Culture adequate volume Performed at [place] [address] [place], [place] [address]    Culture   Final    NO GROWTH 2 DAYS Performed at [facility] [address], [place], [place] [address]    Report Status PENDING  Incomplete  Blood Culture (routine x 2)     Status: None (Preliminary result)   Collection Time: 02/20/19  1:50 PM   Specimen: BLOOD  Result Value Ref Range Status   Specimen Description   Final    BLOOD RIGHT ANTECUBITAL Performed at [facility] [address] [place], [place] [address]    Special Requests   Final    BOTTLES DRAWN AEROBIC ONLY Blood Culture adequate volume Performed at [facility] [address] [place], [place] [address]    Culture   Final    NO GROWTH 2 DAYS Performed at [facility] [address], [place], [place] [address]    Report Status PENDING  Incomplete         Radiology Studies: No results found.      Scheduled Meds: . atorvastatin  20 mg Oral Daily  . dexamethasone (DECADRON) injection  6 mg Intravenous Daily  . enoxaparin (LOVENOX) injection  60 mg Subcutaneous Q24H  . fluticasone  2 spray Each Nare Daily  . loratadine  10 mg Oral Daily  . vitamin C  500 mg Oral Daily  . zinc sulfate  220 mg Oral Daily   Continuous Infusions: . remdesivir 100 mg in NS 250 mL Stopped (02/22/19 1818)     LOS: 3 days    Time spent: 35 minutes      Edwin Dada, MD [facility] 02/23/2019, 10:08 AM     Please page through [place]:  [email or web address] Password [email or web address] If 7PM-7AM, please contact night-coverage

## 2019-02-24 LAB — CBC
HCT: 51.9 % (ref 39.0–52.0)
Hemoglobin: 15.1 g/dL (ref 13.0–17.0)
MCH: 21.5 pg — ABNORMAL LOW (ref 26.0–34.0)
MCHC: 29.1 g/dL — ABNORMAL LOW (ref 30.0–36.0)
MCV: 73.8 fL — ABNORMAL LOW (ref 80.0–100.0)
Platelets: 317 10*3/uL (ref 150–400)
RBC: 7.03 MIL/uL — ABNORMAL HIGH (ref 4.22–5.81)
RDW: 15.5 % (ref 11.5–15.5)
WBC: 6.5 10*3/uL (ref 4.0–10.5)
nRBC: 0 % (ref 0.0–0.2)

## 2019-02-24 LAB — COMPREHENSIVE METABOLIC PANEL
ALT: 90 U/L — ABNORMAL HIGH (ref 0–44)
AST: 85 U/L — ABNORMAL HIGH (ref 15–41)
Albumin: 3.2 g/dL — ABNORMAL LOW (ref 3.5–5.0)
Alkaline Phosphatase: 69 U/L (ref 38–126)
Anion gap: 8 (ref 5–15)
BUN: 24 mg/dL — ABNORMAL HIGH (ref 6–20)
CO2: 26 mmol/L (ref 22–32)
Calcium: 11.3 mg/dL — ABNORMAL HIGH (ref 8.9–10.3)
Chloride: 106 mmol/L (ref 98–111)
Creatinine, Ser: 0.89 mg/dL (ref 0.61–1.24)
GFR calc Af Amer: >60 mL/min (ref >60)
GFR calc non Af Amer: >60 mL/min (ref >60)
Glucose, Bld: 79 mg/dL (ref 70–99)
Potassium: 4.4 mmol/L (ref 3.5–5.1)
Sodium: 140 mmol/L (ref 135–145)
Total Bilirubin: 0.6 mg/dL (ref 0.3–1.2)
Total Protein: 6.6 g/dL (ref 6.5–8.1)

## 2019-02-24 LAB — HEPATITIS B SURFACE ANTIGEN: Hepatitis B Surface Ag: NEGATIVE

## 2019-02-24 LAB — MAGNESIUM: Magnesium: 2.1 mg/dL (ref 1.7–2.4)

## 2019-02-24 LAB — HEPATITIS B SURFACE ANTIBODY, QUANTITATIVE: Hep B S AB Quant (Post): 3.9 m[IU]/mL — ABNORMAL LOW (ref >9.9)

## 2019-02-24 NOTE — Progress Notes (Signed)
Patient deferred family update phone call. 

## 2019-02-24 NOTE — Progress Notes (Signed)
PROGRESS NOTE    George Gibson  DDU:[id number] DOB: 1975/10/12 DOA: 02/20/2019 PCP: Patient, No Pcp Per      Brief Narrative:  George Gibson is a 43 y.o. F with morbid obesity, asthma who presented with few days progressive cough, SOB, feve and weakness.  Hypoxic to 80s and CXR with multifocal pneumonia.     Assessment & Plan:  Coronavirus pneumonitis with acute hypoxic respiratory failure In setting of ongoing 2020 COVID-19 pandemic.  Remdesivir start 7/14 S/p Actemra 7/15  O2 stable overnight at 3L.  LFTs stable Hep B serologies negative.  Quant gold indeterminate, but see prior notes, no clinical suspicion for active TB, low suspicion for latent TB. -Continue remdesivir day 5 of 5 -Continue Steroids, day 5 of 10  -Continue VTE PPx with Lovenox -Continue Zinc and Vitamin C -Daily LFTs on remdesivir    Obesity Hyperlipidemia -Continue atorvastatin  Asthma No wheezing, he thinks he does not have asthma. -Continue home anti-histamine, nasal steroid  Hyperkalemia Resolved      MDM and disposition: The below labs and imaging reports were reviewed and summarized above.  Medication management as above.  Patient was admitted with acute hypoxic respiratory failure from severe COVID-19.  His oxygen levels have been able to wean down over the last 24 hours, and he is now on 3 L of oxygen.  We will finish remdesivir today, continue steroids, and likely home over the next several days IV continues to improve to a stable oxygenation level.       DVT prophylaxis: Lovenox Code Status: FULL Family Communication: None    Consultants:   None  Procedures:   None  Antimicrobials:   Ceftriaxone 7/14 x1  Azithromycin 7/14 x1   Culture data:   7/14 blood culture x3 -- NGTD       Subjective: Occasional cough, dizzy with standing up sometimes, out of breath with exertion and dizzy with exertion.  Otherwise diarrhea is resolved, no chest pain, no sputum  production, no confusion, no leg swelling.        Objective: Vitals:   02/23/19 2200 02/24/19 0400 02/24/19 0900 02/24/19 0933  BP:   110/72   Pulse:      Resp:      Temp:  98 F (36.7 C) 98.1 F (36.7 C)   TempSrc:  Oral Oral   SpO2: 95%   90%  Weight:      Height:        Intake/Output Summary (Last 24 hours) at 02/24/2019 1425 Last data filed at 02/24/2019 0400 Gross per 24 hour  Intake -  Output 575 ml  Net -575 ml   Filed Weights   02/20/19 1334  Weight: 127 kg    Examination: General appearance: Obese adult male, lying in bed, no acute distress, interactive HEENT: Anicteric, conjunctiva pink, lids and lashes normal. No nasal deformity, discharge, epistaxis.  Lips moist, teeth normal, OP moist, no oral lesions.   Skin: Warm and dry.  No jaundice.  No suspicious rashes or lesions. Cardiac: Regular rate and rhythm, no murmurs, no extra lower extremity edema, JVP not visible. Respiratory: Respiratory effort increased, but no accessory muscle use.  No nasal flaring.  Lung sounds diminished bilaterally, no wheezing, no rales. Abdomen: Abdomen soft without tenderness palpation or guarding.   MSK: No deformities or effusions. Neuro: Awake and alert, extraocular movements intact, moves all extremities, speech fluent. Psych: Sensorium intact and responding to questions, attention normal, affect normal, judgment and insight appear normal.  Data Reviewed: I have personally reviewed following labs and imaging studies:  CBC: Recent Labs  Lab 02/20/19 1350 02/21/19 0300 02/22/19 0245 02/23/19 0500 02/24/19 0336  WBC 6.7 5.8 8.0 8.1 6.5  NEUTROABS 5.4  --   --   --   --   HGB 15.5 15.3 15.1 15.1 15.1  HCT 52.8* 52.1* 50.8 52.5* 51.9  MCV 74.2* 74.9* 73.8* 74.4* 73.8*  PLT 190 195 269 309 317   Basic Metabolic Panel: Recent Labs  Lab 02/20/19 1350 02/21/19 0300 02/22/19 0245 02/23/19 0500 02/24/19 0336  NA 133* 136 138 140 140  K 4.4 5.2* 4.8 4.3 4.4   CL 100 104 106 105 106  CO2 22 23 23 26 26   GLUCOSE 91 120* 123* 90 79  BUN 17 19 25* 25* 24*  CREATININE 1.40* 1.18 0.95 0.88 0.89  CALCIUM 10.8* 11.5* 11.4* 11.5* 11.3*  MG  --  1.7 1.9 2.0 2.1  PHOS  --  2.0*  --   --   --    GFR: Estimated Creatinine Clearance: 137.5 mL/min (by C-G formula based on SCr of 0.89 mg/dL). Liver Function Tests: Recent Labs  Lab 02/20/19 1350 02/21/19 0300 02/22/19 0245 02/23/19 0500 02/24/19 0336  AST 114* 151* 126* 97* 85*  ALT 59* 61* 64* 72* 90*  ALKPHOS 80 76 70 70 69  BILITOT 0.8 0.7 0.5 0.5 0.6  PROT 7.5 7.3 6.9 6.9 6.6  ALBUMIN 3.6 3.2* 3.1* 3.2* 3.2*   No results for input(s): LIPASE, AMYLASE in the last 168 hours. No results for input(s): AMMONIA in the last 168 hours. Coagulation Profile: No results for input(s): INR, PROTIME in the last 168 hours. Cardiac Enzymes: Recent Labs  Lab 02/21/19 0300  CKTOTAL 6,437*   BNP (last 3 results) No results for input(s): PROBNP in the last 8760 hours. HbA1C: No results for input(s): HGBA1C in the last 72 hours. CBG: No results for input(s): GLUCAP in the last 168 hours. Lipid Profile: No results for input(s): CHOL, HDL, LDLCALC, TRIG, CHOLHDL, LDLDIRECT in the last 72 hours. Thyroid Function Tests: No results for input(s): TSH, T4TOTAL, FREET4, T3FREE, THYROIDAB in the last 72 hours. Anemia Panel: Recent Labs    02/22/19 0230 02/23/19 0500  FERRITIN 1,392* 1,234*   Urine analysis: No results found for: COLORURINE, APPEARANCEUR, LABSPEC, PHURINE, GLUCOSEU, HGBUR, BILIRUBINUR, KETONESUR, PROTEINUR, UROBILINOGEN, NITRITE, LEUKOCYTESUR Sepsis Labs: @LABRCNTIP (procalcitonin:4,lacticacidven:4)  ) Recent Results (from the past 240 hour(s))  SARS Coronavirus 2 [facility](Hospital order, Performed in [facility] lab)     Status: Abnormal   Collection Time: 02/17/19  3:09 AM   Specimen: Nasopharyngeal Swab  Result Value Ref Range Status   SARS Coronavirus 2 POSITIVE (A) NEGATIVE  Final    Comment: RESULT CALLED TO, READ BACK BY AND VERIFIED WITH: T DOSTER,RN 02/17/19 0530 RHOLMES (NOTE) If result is NEGATIVE SARS-CoV-2 target nucleic acids are NOT DETECTED. The SARS-CoV-2 RNA is generally detectable in upper and lower  respiratory specimens during the acute phase of infection. The lowest  concentration of SARS-CoV-2 viral copies this assay can detect is 250  copies / mL. A negative result does not preclude SARS-CoV-2 infection  and should not be used as the sole basis for treatment or other  patient management decisions.  A negative result may occur with  improper specimen collection / handling, submission of specimen other  than nasopharyngeal swab, presence of viral mutation(s) within the  areas targeted by this assay, and inadequate number of viral copies  (<250  copies / mL). A negative result must be combined with clinical  observations, patient history, and epidemiological information. If result is POSITIVE SARS-CoV-2 target nucleic acids are DETECTED. The  SARS-CoV-2 RNA is generally detectable in upper and lower  respiratory specimens during the acute phase of infection.  Positive  results are indicative of active infection with SARS-CoV-2.  Clinical  correlation with patient history and other diagnostic information is  necessary to determine patient infection status.  Positive results do  not rule out bacterial infection or co-infection with other viruses. If result is PRESUMPTIVE POSTIVE SARS-CoV-2 nucleic acids MAY BE PRESENT.   A presumptive positive result was obtained on the submitted specimen  and confirmed on repeat testing.  While 2019 novel coronavirus  (SARS-CoV-2) nucleic acids may be present in the submitted sample  additional confirmatory testing may be necessary for epidemiological  and / or clinical management purposes  to differentiate between  SARS-CoV-2 and other Sarbecovirus currently known to infect humans.  If clinically indicated  additional testing with an alternate test  methodology [phone] is ad vised. The SARS-CoV-2 RNA is generally  detectable in upper and lower respiratory specimens during the acute  phase of infection. The expected result is Negative. Fact Sheet for Patients:  BoilerBrush.com.cyhttps://www.fda.gov/media/136312/download Fact Sheet for Healthcare Providers: https://pope.com/https://www.fda.gov/media/136313/download This test is not yet approved or cleared by the [place] FDA and has been authorized for detection and/or diagnosis of SARS-CoV-2 by FDA under an Emergency Use Authorization (EUA).  This EUA will remain in effect (meaning this test can be used) for the duration of the COVID-19 declaration under Section 564(b)(1) of the Act, 21 U.S.C. section 360bbb-3(b)(1), unless the authorization is terminated or revoked sooner. Performed at [facility], [address] [place], [place] [address]   Blood Culture (routine x 2)     Status: None   Collection Time: 02/17/19  3:09 AM   Specimen: BLOOD LEFT HAND  Result Value Ref Range Status   Specimen Description   Final    BLOOD LEFT HAND Performed at [facility], [address] [place], [place] [address]    Special Requests   Final    BOTTLES DRAWN AEROBIC AND ANAEROBIC Blood Culture adequate volume Performed at [facility], [address] [place], [place] [address]    Culture   Final    NO GROWTH 5 DAYS Performed at [facility], [address] [place], [place] [address]    Report Status 02/22/2019 FINAL  Final  Blood Culture (routine x 2)     Status: None   Collection Time: 02/17/19  3:14 AM   Specimen: BLOOD  Result Value Ref Range Status   Specimen Description   Final    BLOOD RIGHT ANTECUBITAL Performed at [facility], [address] [place], [place] [address]    Special Requests   Final    BOTTLES DRAWN AEROBIC AND ANAEROBIC Blood Culture adequate volume Performed  at [facility], [address] [place], [place] [address]    Culture   Final    NO GROWTH 5 DAYS Performed at [facility], [address] [place], [place] [address]    Report Status 02/22/2019 FINAL  Final  Blood Culture (routine x 2)     Status: None (Preliminary result)   Collection Time: 02/20/19  1:42 PM   Specimen: BLOOD LEFT HAND  Result Value Ref Range Status   Specimen Description   Final  BLOOD LEFT HAND Performed at [facility], [address] [place], [place] [address]    Special Requests   Final    BOTTLES DRAWN AEROBIC AND ANAEROBIC Blood Culture adequate volume Performed at [facility], [address] [place], [place] [address]    Culture   Final    NO GROWTH 4 DAYS Performed at [facility], [address] [place], [place] [address]    Report Status PENDING  Incomplete  Blood Culture (routine x 2)     Status: None (Preliminary result)   Collection Time: 02/20/19  1:50 PM   Specimen: BLOOD  Result Value Ref Range Status   Specimen Description   Final    BLOOD RIGHT ANTECUBITAL Performed at [facility], [address] [place], [place] [address]    Special Requests   Final    BOTTLES DRAWN AEROBIC ONLY Blood Culture adequate volume Performed at [facility], [address] [place], [place] [address]    Culture   Final    NO GROWTH 4 DAYS Performed at [facility], [address] [place], [place] [address]    Report Status PENDING  Incomplete         Radiology Studies: No results found.      Scheduled Meds: . atorvastatin  20 mg Oral Daily  . dexamethasone (DECADRON) injection  6 mg Intravenous Daily  . enoxaparin (LOVENOX) injection  60 mg Subcutaneous Q24H  . fluticasone  2 spray Each Nare Daily  . loratadine  10 mg Oral Daily  . vitamin C  500 mg Oral Daily  . zinc sulfate  220 mg Oral Daily   Continuous Infusions:  . remdesivir 100 mg in NS 250 mL 100 mg (02/23/19 1624)     LOS: 4 days    Time spent: 25 minutes      Alberteen Samhristopher P Danford, MD [facility] 02/24/2019, 2:25 PM     Please page through AMION:  [email or web address] Password [email or web address] If 7PM-7AM, please contact night-coverage

## 2019-02-25 LAB — CBC
HCT: 53.2 % — ABNORMAL HIGH (ref 39.0–52.0)
Hemoglobin: 15.4 g/dL (ref 13.0–17.0)
MCH: 21.4 pg — ABNORMAL LOW (ref 26.0–34.0)
MCHC: 28.9 g/dL — ABNORMAL LOW (ref 30.0–36.0)
MCV: 73.8 fL — ABNORMAL LOW (ref 80.0–100.0)
Platelets: 348 10*3/uL (ref 150–400)
RBC: 7.21 MIL/uL — ABNORMAL HIGH (ref 4.22–5.81)
RDW: 15.7 % — ABNORMAL HIGH (ref 11.5–15.5)
WBC: 7.3 10*3/uL (ref 4.0–10.5)
nRBC: 0 % (ref 0.0–0.2)

## 2019-02-25 LAB — COMPREHENSIVE METABOLIC PANEL
ALT: 92 U/L — ABNORMAL HIGH (ref 0–44)
AST: 67 U/L — ABNORMAL HIGH (ref 15–41)
Albumin: 3.1 g/dL — ABNORMAL LOW (ref 3.5–5.0)
Alkaline Phosphatase: 68 U/L (ref 38–126)
Anion gap: 7 (ref 5–15)
BUN: 21 mg/dL — ABNORMAL HIGH (ref 6–20)
CO2: 27 mmol/L (ref 22–32)
Calcium: 11.3 mg/dL — ABNORMAL HIGH (ref 8.9–10.3)
Chloride: 105 mmol/L (ref 98–111)
Creatinine, Ser: 0.91 mg/dL (ref 0.61–1.24)
GFR calc Af Amer: >60 mL/min (ref >60)
GFR calc non Af Amer: >60 mL/min (ref >60)
Glucose, Bld: 73 mg/dL (ref 70–99)
Potassium: 4.4 mmol/L (ref 3.5–5.1)
Sodium: 139 mmol/L (ref 135–145)
Total Bilirubin: 0.4 mg/dL (ref 0.3–1.2)
Total Protein: 6.3 g/dL — ABNORMAL LOW (ref 6.5–8.1)

## 2019-02-25 LAB — CULTURE, BLOOD (ROUTINE X 2)
Culture: NO GROWTH
Culture: NO GROWTH
Special Requests: ADEQUATE
Special Requests: ADEQUATE

## 2019-02-25 LAB — MAGNESIUM: Magnesium: 2 mg/dL (ref 1.7–2.4)

## 2019-02-25 MED ORDER — ZOLPIDEM TARTRATE 5 MG PO TABS: 5.0000 mg | ORAL_TABLET | Freq: Every evening | ORAL | Status: DC | PRN

## 2019-02-25 NOTE — Progress Notes (Signed)
Ambulated in hall

## 2019-02-25 NOTE — Progress Notes (Signed)
Attempted to call Pt';s contact - father - no answer or VM.

## 2019-02-25 NOTE — Plan of Care (Signed)
  Problem: Education: Goal: Knowledge of General Education information will improve Description: Including pain rating scale, medication(s)/side effects and non-pharmacologic comfort measures Outcome: Progressing   Problem: Safety: Goal: Ability to remain free from injury will improve Outcome: Progressing   Problem: Education: Goal: Knowledge of risk factors and measures for prevention of condition will improve Outcome: Progressing   Problem: Coping: Goal: Psychosocial and spiritual needs will be supported Outcome: Progressing   Problem: Respiratory: Goal: Will maintain a patent airway Outcome: Progressing Goal: Complications related to the disease process, condition or treatment will be avoided or minimized Outcome: Progressing   

## 2019-02-25 NOTE — Progress Notes (Signed)
Patient deferred family update phone call. 

## 2019-02-25 NOTE — Progress Notes (Signed)
PROGRESS NOTE    George Gibson  BJY:[id number] DOB: 10/10/1975 DOA: 02/20/2019 PCP: Patient, No Pcp Per      Brief Narrative:  George Gibson is a 43 y.o. F with morbid obesity, asthma who presented with few days progressive cough, SOB, feve and weakness.  Hypoxic to 80s and CXR with multifocal pneumonia.     Assessment & Plan:  Coronavirus pneumonitis with acute hypoxic respiratory failure In setting of ongoing 2020 COVID-19 pandemic.  S/p remdesivir 5 days S/p Actemra 7/15  Weaned O2 yesterday, down to 1L this morning.  LFTs stable Hep B serologies negative.  Quant gold indeterminate, but see prior notes, no clinical suspicion for active TB, low suspicion for latent TB. -Continue Steroids, day 6  -Continue VTE PPx with Lovenox -Continue Zinc and Vitamin C    Obesity Hyperlipidemia -Continue atorvastatin  Asthma No wheezing, he thinks he does not have asthma. -Continue home anti-histamine, nasal steroid  Hyperkalemia Resolved      MDM and disposition: The below labs and imaging reports were reviewed and summarized above.  Medication management as above.  The patient was admitted with acute hypoxic respiratory failure from severe COVID-19.  His oxygen levels continue to be weaned down over the last 24 hours, we have finished remdesivir, but will continue steroids and monitor his oxygenation and his exercise capacity over the next day.  PT with patient and possibly home within 24 to 48 hours.          DVT prophylaxis: Lovenox Code Status: FULL Family Communication: None    Consultants:   None  Procedures:   None  Antimicrobials:   Ceftriaxone 7/14 x1  Azithromycin 7/14 x1   Culture data:   7/14 blood culture x3 -- NGTD       Subjective: Cough is improving, he is no longer dizzy, he has no current diarrhea, chest pain, confusion, no further fever.     Objective: Vitals:   02/25/19 0404 02/25/19 0405 02/25/19 0745 02/25/19 0839  BP:  105/74   99/76  Pulse: 86  (!) 106 96  Resp:  18  (!) 28  Temp:  98.1 F (36.7 C)  97.9 F (36.6 C)  TempSrc:  Oral  Oral  SpO2: 91%  95% 93%  Weight:      Height:        Intake/Output Summary (Last 24 hours) at 02/25/2019 1218 Last data filed at 02/25/2019 0900 Gross per 24 hour  Intake 120 ml  Output 400 ml  Net -280 ml   Filed Weights   02/20/19 1334  Weight: 127 kg    Examination: General appearance: Obese adult male, no acute distress, interactive.  Sitting on the edge of the bed, eating breakfast. HEENT: Anicteric, conjunctiva pink, lids and lashes normal. No nasal deformity, discharge, epistaxis.  Lips moist, teeth normal, OP moist, no oral lesions.   Skin: Warm and dry.  No jaundice.  No suspicious rashes or lesions. Cardiac: Regular rate and rhythm, no murmurs, no lower extremity edema, JVP not visible Respiratory: Gwyndolyn Kaufmanorrey effort improving, no accessory muscle use, lung sounds diminished bilaterally, no wheezing or rales. Abdomen: Abdomen soft without tenderness to palpation or guarding. MSK: No deformities or effusions. Neuro: Awake and alert, extraocular movements intact, moves all extremities with normal strength and coordination, speech fluent Psych: Sensorium intact responding to questions, attention normal, affect normal, judgment insight appear normal.       Data Reviewed: I have personally reviewed following labs and imaging studies:  CBC:  Recent Labs  Lab 02/20/19 1350 02/21/19 0300 02/22/19 0245 02/23/19 0500 02/24/19 0336 02/25/19 0346  WBC 6.7 5.8 8.0 8.1 6.5 7.3  NEUTROABS 5.4  --   --   --   --   --   HGB 15.5 15.3 15.1 15.1 15.1 15.4  HCT 52.8* 52.1* 50.8 52.5* 51.9 53.2*  MCV 74.2* 74.9* 73.8* 74.4* 73.8* 73.8*  PLT 190 195 269 309 317 348   Basic Metabolic Panel: Recent Labs  Lab 02/21/19 0300 02/22/19 0245 02/23/19 0500 02/24/19 0336 02/25/19 0346  NA 136 138 140 140 139  K 5.2* 4.8 4.3 4.4 4.4  CL 104 106 105 106 105  CO2  23 23 26 26 27   GLUCOSE 120* 123* 90 79 73  BUN 19 25* 25* 24* 21*  CREATININE 1.18 0.95 0.88 0.89 0.91  CALCIUM 11.5* 11.4* 11.5* 11.3* 11.3*  MG 1.7 1.9 2.0 2.1 2.0  PHOS 2.0*  --   --   --   --    GFR: Estimated Creatinine Clearance: 134.5 mL/min (by C-G formula based on SCr of 0.91 mg/dL). Liver Function Tests: Recent Labs  Lab 02/21/19 0300 02/22/19 0245 02/23/19 0500 02/24/19 0336 02/25/19 0346  AST 151* 126* 97* 85* 67*  ALT 61* 64* 72* 90* 92*  ALKPHOS 76 70 70 69 68  BILITOT 0.7 0.5 0.5 0.6 0.4  PROT 7.3 6.9 6.9 6.6 6.3*  ALBUMIN 3.2* 3.1* 3.2* 3.2* 3.1*   No results for input(s): LIPASE, AMYLASE in the last 168 hours. No results for input(s): AMMONIA in the last 168 hours. Coagulation Profile: No results for input(s): INR, PROTIME in the last 168 hours. Cardiac Enzymes: Recent Labs  Lab 02/21/19 0300  CKTOTAL 6,437*   BNP (last 3 results) No results for input(s): PROBNP in the last 8760 hours. HbA1C: No results for input(s): HGBA1C in the last 72 hours. CBG: No results for input(s): GLUCAP in the last 168 hours. Lipid Profile: No results for input(s): CHOL, HDL, LDLCALC, TRIG, CHOLHDL, LDLDIRECT in the last 72 hours. Thyroid Function Tests: No results for input(s): TSH, T4TOTAL, FREET4, T3FREE, THYROIDAB in the last 72 hours. Anemia Panel: Recent Labs    02/23/19 0500  FERRITIN 1,234*   Urine analysis: No results found for: COLORURINE, APPEARANCEUR, LABSPEC, PHURINE, GLUCOSEU, HGBUR, BILIRUBINUR, KETONESUR, PROTEINUR, UROBILINOGEN, NITRITE, LEUKOCYTESUR Sepsis Labs: @LABRCNTIP (procalcitonin:4,lacticacidven:4)  ) Recent Results (from the past 240 hour(s))  SARS Coronavirus 2 [facility](Hospital order, Performed in [facility] hospital lab)     Status: Abnormal   Collection Time: 02/17/19  3:09 AM   Specimen: Nasopharyngeal Swab  Result Value Ref Range Status   SARS Coronavirus 2 POSITIVE (A) NEGATIVE Final    Comment: RESULT CALLED TO, READ BACK BY AND  VERIFIED WITH: T DOSTER,RN 02/17/19 0530 RHOLMES (NOTE) If result is NEGATIVE SARS-CoV-2 target nucleic acids are NOT DETECTED. The SARS-CoV-2 RNA is generally detectable in upper and lower  respiratory specimens during the acute phase of infection. The lowest  concentration of SARS-CoV-2 viral copies this assay can detect is 250  copies / mL. A negative result does not preclude SARS-CoV-2 infection  and should not be used as the sole basis for treatment or other  patient management decisions.  A negative result may occur with  improper specimen collection / handling, submission of specimen other  than nasopharyngeal swab, presence of viral mutation(s) within the  areas targeted by this assay, and inadequate number of viral copies  (<250 copies / mL). A negative result must be  combined with clinical  observations, patient history, and epidemiological information. If result is POSITIVE SARS-CoV-2 target nucleic acids are DETECTED. The  SARS-CoV-2 RNA is generally detectable in upper and lower  respiratory specimens during the acute phase of infection.  Positive  results are indicative of active infection with SARS-CoV-2.  Clinical  correlation with patient history and other diagnostic information is  necessary to determine patient infection status.  Positive results do  not rule out bacterial infection or co-infection with other viruses. If result is PRESUMPTIVE POSTIVE SARS-CoV-2 nucleic acids MAY BE PRESENT.   A presumptive positive result was obtained on the submitted specimen  and confirmed on repeat testing.  While 2019 novel coronavirus  (SARS-CoV-2) nucleic acids may be present in the submitted sample  additional confirmatory testing may be necessary for epidemiological  and / or clinical management purposes  to differentiate between  SARS-CoV-2 and other Sarbecovirus currently known to infect humans.  If clinically indicated additional testing with an alternate test   methodology [phone]([id number]) is ad vised. The SARS-CoV-2 RNA is generally  detectable in upper and lower respiratory specimens during the acute  phase of infection. The expected result is Negative. Fact Sheet for Patients:  BoilerBrush.com.cyhttps://www.fda.gov/media/136312/download Fact Sheet for Healthcare Providers: https://pope.com/https://www.fda.gov/media/136313/download This test is not yet approved or cleared by the [place] FDA and has been authorized for detection and/or diagnosis of SARS-CoV-2 by FDA under an Emergency Use Authorization (EUA).  This EUA will remain in effect (meaning this test can be used) for the duration of the COVID-19 declaration under Section 564(b)(1) of the Act, 21 U.S.C. section 360bbb-3(b)(1), unless the authorization is terminated or revoked sooner. Performed at [facility], [address] [place], [place] [address]   Blood Culture (routine x 2)     Status: None   Collection Time: 02/17/19  3:09 AM   Specimen: BLOOD LEFT HAND  Result Value Ref Range Status   Specimen Description   Final    BLOOD LEFT HAND Performed at [facility], [address] [place], [place] [address]    Special Requests   Final    BOTTLES DRAWN AEROBIC AND ANAEROBIC Blood Culture adequate volume Performed at [facility], [address] [place], [place] [address]    Culture   Final    NO GROWTH 5 DAYS Performed at [facility], [address] [place], [place] [address]    Report Status 02/22/2019 FINAL  Final  Blood Culture (routine x 2)     Status: None   Collection Time: 02/17/19  3:14 AM   Specimen: BLOOD  Result Value Ref Range Status   Specimen Description   Final    BLOOD RIGHT ANTECUBITAL Performed at [facility], [address] [place], [place] [address]    Special Requests   Final    BOTTLES DRAWN AEROBIC AND ANAEROBIC Blood Culture adequate volume Performed at [facility], [address] [place], [place] [address]    Culture   Final    NO GROWTH 5 DAYS Performed at [facility], [address] [place], [place] [address]    Report Status 02/22/2019 FINAL  Final  Blood Culture (routine x 2)     Status: None   Collection Time: 02/20/19  1:42 PM   Specimen: BLOOD LEFT HAND  Result Value Ref Range Status   Specimen Description   Final    BLOOD LEFT HAND Performed at [facility], [place] [address], [place], [place] [address]    Special Requests   Final    BOTTLES DRAWN AEROBIC AND ANAEROBIC Blood Culture adequate volume Performed at [place] [address], [place], [place] [address]    Culture   Final    NO GROWTH 5 DAYS Performed at [facility], [place] [address] [place], [place] [address]    Report Status 02/25/2019 FINAL  Final  Blood Culture (routine x 2)     Status: None   Collection Time: 02/20/19  1:50 PM   Specimen: BLOOD  Result Value Ref Range Status   Specimen Description   Final    BLOOD RIGHT ANTECUBITAL Performed at [place] [address] [place], [place] [address]    Special Requests   Final    BOTTLES DRAWN AEROBIC ONLY Blood Culture adequate volume Performed at [place] [address], [place], [place] [address]    Culture   Final    NO GROWTH 5 DAYS Performed at [facility], [place] [address] [place], [place] [address]    Report Status 02/25/2019 FINAL  Final         Radiology Studies: No results found.      Scheduled Meds: . atorvastatin  20 mg Oral Daily  . dexamethasone (DECADRON) injection  6 mg Intravenous Daily  . enoxaparin (LOVENOX) injection  60 mg Subcutaneous Q24H  . fluticasone  2 spray Each Nare Daily  . loratadine  10 mg Oral Daily  . vitamin C  500 mg Oral Daily  . zinc sulfate  220 mg Oral Daily   Continuous Infusions:    LOS: 5 days    Time spent: 25 minutes      Edwin Dada, MD [facility] 02/25/2019, 12:18 PM     Please page through [place]:  [email or web address] Password [email or web address] If 7PM-7AM, please contact night-coverage

## 2019-02-26 LAB — COMPREHENSIVE METABOLIC PANEL
ALT: 104 U/L — ABNORMAL HIGH (ref 0–44)
AST: 64 U/L — ABNORMAL HIGH (ref 15–41)
Albumin: 3.3 g/dL — ABNORMAL LOW (ref 3.5–5.0)
Alkaline Phosphatase: 78 U/L (ref 38–126)
Anion gap: 10 (ref 5–15)
BUN: 24 mg/dL — ABNORMAL HIGH (ref 6–20)
CO2: 24 mmol/L (ref 22–32)
Calcium: 11.2 mg/dL — ABNORMAL HIGH (ref 8.9–10.3)
Chloride: 103 mmol/L (ref 98–111)
Creatinine, Ser: 0.99 mg/dL (ref 0.61–1.24)
GFR calc Af Amer: >60 mL/min (ref >60)
GFR calc non Af Amer: >60 mL/min (ref >60)
Glucose, Bld: 93 mg/dL (ref 70–99)
Potassium: 4.1 mmol/L (ref 3.5–5.1)
Sodium: 137 mmol/L (ref 135–145)
Total Bilirubin: 0.7 mg/dL (ref 0.3–1.2)
Total Protein: 6.6 g/dL (ref 6.5–8.1)

## 2019-02-26 LAB — CBC
HCT: 52.7 % — ABNORMAL HIGH (ref 39.0–52.0)
Hemoglobin: 15.5 g/dL (ref 13.0–17.0)
MCH: 21.6 pg — ABNORMAL LOW (ref 26.0–34.0)
MCHC: 29.4 g/dL — ABNORMAL LOW (ref 30.0–36.0)
MCV: 73.3 fL — ABNORMAL LOW (ref 80.0–100.0)
Platelets: 384 10*3/uL (ref 150–400)
RBC: 7.19 MIL/uL — ABNORMAL HIGH (ref 4.22–5.81)
RDW: 15.5 % (ref 11.5–15.5)
WBC: 10.2 10*3/uL (ref 4.0–10.5)
nRBC: 0 % (ref 0.0–0.2)

## 2019-02-26 MED ORDER — DEXAMETHASONE 6 MG PO TABS: 6.0000 mg | ORAL_TABLET | Freq: Every day | ORAL | 0 refills | Status: AC

## 2019-02-26 NOTE — Progress Notes (Signed)
Discharge instructions reviewed with Pt. Pt aware of his prescription for pick-up.  Pt aware to call his PCP to schedule appt. In 1- 1.5 weeks for f/u.  Pt aware of CDC guidelines.  He did sign form, added to his chart.

## 2019-02-26 NOTE — Plan of Care (Signed)
  Problem: Education: Goal: Knowledge of General Education information will improve Description: Including pain rating scale, medication(s)/side effects and non-pharmacologic comfort measures 02/26/2019 1608 by Shanon Ace, RN Outcome: Adequate for Discharge 02/26/2019 0741 by Shanon Ace, RN Outcome: Progressing   Problem: Safety: Goal: Ability to remain free from injury will improve 02/26/2019 1608 by Shanon Ace, RN Outcome: Adequate for Discharge 02/26/2019 0741 by Shanon Ace, RN Outcome: Progressing   Problem: Education: Goal: Knowledge of risk factors and measures for prevention of condition will improve 02/26/2019 1608 by Shanon Ace, RN Outcome: Adequate for Discharge 02/26/2019 0741 by Shanon Ace, RN Outcome: Progressing   Problem: Coping: Goal: Psychosocial and spiritual needs will be supported 02/26/2019 1608 by Shanon Ace, RN Outcome: Adequate for Discharge 02/26/2019 0741 by Shanon Ace, RN Outcome: Progressing   Problem: Respiratory: Goal: Will maintain a patent airway 02/26/2019 1608 by Shanon Ace, RN Outcome: Adequate for Discharge 02/26/2019 0741 by Shanon Ace, RN Outcome: Progressing Goal: Complications related to the disease process, condition or treatment will be avoided or minimized 02/26/2019 1608 by Shanon Ace, RN Outcome: Adequate for Discharge 02/26/2019 0741 by Shanon Ace, RN Outcome: Progressing

## 2019-02-26 NOTE — Progress Notes (Signed)
PT Cancellation Note  Patient Details Name: George Gibson MRN: [id number] DOB: [birth year]   Cancelled Treatment:    Reason Eval/Treat Not Completed: PT screened, no needs identified, will sign off   Dr. Loleta Books present in room on arrival. Reports pt walked 5 laps around unit yesterday. Patient reports he feels he is at his baseline for balance. Evaluation deferred.    Jeanie Cooks Kalima Saylor, PT 02/26/2019, 9:21 AM

## 2019-02-26 NOTE — Plan of Care (Signed)
  Problem: Education: Goal: Knowledge of General Education information will improve Description: Including pain rating scale, medication(s)/side effects and non-pharmacologic comfort measures Outcome: Progressing   Problem: Safety: Goal: Ability to remain free from injury will improve Outcome: Progressing   Problem: Education: Goal: Knowledge of risk factors and measures for prevention of condition will improve Outcome: Progressing   Problem: Coping: Goal: Psychosocial and spiritual needs will be supported Outcome: Progressing   Problem: Respiratory: Goal: Will maintain a patent airway Outcome: Progressing Goal: Complications related to the disease process, condition or treatment will be avoided or minimized Outcome: Progressing   

## 2019-02-26 NOTE — Discharge Instructions (Signed)
Person Under Monitoring Name: George Gibson  Location: [address] [place] [address]   CORONAVIRUS DISEASE 2019 (COVID-19) Guidance for Persons Under Investigation You are being tested for the virus that causes coronavirus disease 2019 (COVID-19). Public health actions are necessary to ensure protection of your health and the health of others, and to prevent further spread of infection. COVID-19 is caused by a virus that can cause symptoms, such as fever, cough, and shortness of breath. The primary transmission from person to person is by coughing or sneezing. On September 07, 2018, the World Health Organization announced a [employer] Emergency of International Concern and on September 08, 2018 the [place] Department of Health and Human Services declared a public health emergency. If the virus that causesCOVID-19 spreads in the community, it could have severe public health consequences.  As a person under investigation for COVID-19, the [employer] of [facility] and [employer], Division of [employer] advises you to adhere to the following guidance until your test results are reported to you. If your test result is positive, you will receive additional information from your provider and your local health department at that time.  Remain at home until you are cleared by your health provider or public health authorities.  Keep a log of visitors to your home using the form provided. Any visitors to your home must be aware of your isolation status. If you plan to move to a new address or leave the county, notify the local health department in your county. Call a doctor or seek care if you have an urgent medical need. Before seeking medical care, call ahead and get instructions from the provider before arriving at the medical office, clinic or hospital. Notify them that you are being tested for the virus that causes COVID-19 so arrangements can be made, as necessary, to prevent  transmission to others in the healthcare setting. Next, notify the local health department in your county. If a medical emergency arises and you need to call 911, inform the first responders that you are being tested for the virus that causes COVID-19. Next, notify the local health department in your county. Adhere to all guidance set forth by the [facility] Division of [employer] for [facility] of patients that is based on guidance from the Center for Disease Control and Prevention with suspected or confirmed COVID-19. It is provided with this guidance for Persons Under Investigation.  Your health and the health of our community are our top priorities. Public Health officials remain available to provide assistance and counseling to you about COVID-19 and compliance with this guidance.  Provider: ____________________________________________________________ Date: ______/_____/_________  By signing below, you acknowledge that you have read and agree to comply with this Guidance for Persons Under Investigation. ______________________________________________________________ Date: ______/_____/_________  WHO DO I CALL? You can find a list of local health departments here: [email or web address] Health Department: ____________________________________________________________________ Contact Name: ________________________________________________________________________ Telephone: ___________________________________________________________________________  Nedra Hai, Division of Public Health, Communicable Disease Branch COVID-19 Guidance for Persons Under Investigation March 7, 2020Person Under Monitoring Name: George Gibson  Location: [address] [place] [address]   Record here the list of visitors to your home since you became ill with respiratory symptoms that led you to consult a health provider:  Visitor Name Date Time In  Time Out Did this person come within 6 feet of you? Indicate Y or N Relationship to Person Under Monitoring Phone number Comments   ___/____/____ __:__ AM/PM __:__ AM/PM  ___/____/____ __:__ AM/PM __:__ AM/PM       ___/____/____ __:__ AM/PM __:__ AM/PM       ___/____/____ __:__ AM/PM __:__ AM/PM       ___/____/____ __:__ AM/PM __:__ AM/PM       ___/____/____ __:__ AM/PM __:__ AM/PM       ___/____/____ __:__ AM/PM __:__ AM/PM       ___/____/____ __:__ AM/PM __:__ AM/PM       ___/____/____ __:__ AM/PM __:__ AM/PM       ___/____/____ __:__ AM/PM __:__ AM/PM       ___/____/____ __:__ AM/PM __:__ AM/PM       ___/____/____ __:__ AM/PM __:__ AM/PM       ___/____/____ __:__ AM/PM __:__ AM/PM       ___/____/____ __:__ AM/PM __:__ AM/PM       [facility], Division of Public Health, Communicable Disease BranchCOVID-19 COVID-19 is a respiratory infection that is caused by a virus called severe acute respiratory syndrome coronavirus 2 (SARS-CoV-2). The disease is also known as coronavirus disease or novel coronavirus. In some people, the virus may not cause any symptoms. In others, it may cause a serious infection. The infection can get worse quickly and can lead to complications, such as:  Pneumonia, or infection of the lungs.  Acute respiratory distress syndrome or ARDS. This is fluid build-up in the lungs.  Acute respiratory failure. This is a condition in which there is not enough oxygen passing from the lungs to the body.  Sepsis or septic shock. This is a serious bodily reaction to an infection.  Blood clotting problems.  Secondary infections due to bacteria or fungus. The virus that causes COVID-19 is contagious. This means that it can spread from person to person through droplets from coughs and sneezes (respiratory secretions). What are the causes? This illness is caused by a virus. You may catch the virus by:  Breathing in droplets from an infected person's cough  or sneeze.  Touching something, like a table or a doorknob, that was exposed to the virus (contaminated) and then touching your mouth, nose, or eyes. What increases the risk? Risk for infection You are more likely to be infected with this virus if you:  Live in or travel to an area with a COVID-19 outbreak.  Come in contact with a sick person who recently traveled to an area with a COVID-19 outbreak.  Provide care for or live with a person who is infected with COVID-19. Risk for serious illness You are more likely to become seriously ill from the virus if you:  Are 107 years of age or older.  Have a long-term disease that lowers your body's ability to fight infection (immunocompromised).  Live in a nursing home or long-term care facility.  Have a long-term (chronic) disease such as: ? Chronic lung disease, including chronic obstructive pulmonary disease or asthma ? Heart disease. ? Diabetes. ? Chronic kidney disease. ? Liver disease.  Are obese. What are the signs or symptoms? Symptoms of this condition can range from mild to severe. Symptoms may appear any time from 2 to 14 days after being exposed to the virus. They include:  A fever.  A cough.  Difficulty breathing.  Chills.  Muscle pains.  A sore throat.  Loss of taste or smell. Some people may also have stomach problems, such as nausea, vomiting, or diarrhea. Other people may not have any symptoms of COVID-19. How is this diagnosed? This condition may be diagnosed based on:  Your  signs and symptoms, especially if: ? You live in an area with a COVID-19 outbreak. ? You recently traveled to or from an area where the virus is common. ? You provide care for or live with a person who was diagnosed with COVID-19.  A physical exam.  Lab tests, which may include: ? A nasal swab to take a sample of fluid from your nose. ? A throat swab to take a sample of fluid from your throat. ? A sample of mucus from your  lungs (sputum). ? Blood tests.  Imaging tests, which may include, X-rays, CT scan, or ultrasound. How is this treated? At present, there is no medicine to treat COVID-19. Medicines that treat other diseases are being used on a trial basis to see if they are effective against COVID-19. Your health care provider will talk with you about ways to treat your symptoms. For most people, the infection is mild and can be managed at home with rest, fluids, and over-the-counter medicines. Treatment for a serious infection usually takes places in a hospital intensive care unit (ICU). It may include one or more of the following treatments. These treatments are given until your symptoms improve.  Receiving fluids and medicines through an IV.  Supplemental oxygen. Extra oxygen is given through a tube in the nose, a face mask, or a hood.  Positioning you to lie on your stomach (prone position). This makes it easier for oxygen to get into the lungs.  Continuous positive airway pressure (CPAP) or bi-level positive airway pressure (BPAP) machine. This treatment uses mild air pressure to keep the airways open. A tube that is connected to a motor delivers oxygen to the body.  Ventilator. This treatment moves air into and out of the lungs by using a tube that is placed in your windpipe.  Tracheostomy. This is a procedure to create a hole in the neck so that a breathing tube can be inserted.  Extracorporeal membrane oxygenation (ECMO). This procedure gives the lungs a chance to recover by taking over the functions of the heart and lungs. It supplies oxygen to the body and removes carbon dioxide. Follow these instructions at home: Lifestyle  If you are sick, stay home except to get medical care. Your health care provider will tell you how long to stay home. Call your health care provider before you go for medical care.  Rest at home as told by your health care provider.  Do not use any products that contain  nicotine or tobacco, such as cigarettes, e-cigarettes, and chewing tobacco. If you need help quitting, ask your health care provider.  Return to your normal activities as told by your health care provider. Ask your health care provider what activities are safe for you. General instructions  Take over-the-counter and prescription medicines only as told by your health care provider.  Drink enough fluid to keep your urine pale yellow.  Keep all follow-up visits as told by your health care provider. This is important. How is this prevented?  There is no vaccine to help prevent COVID-19 infection. However, there are steps you can take to protect yourself and others from this virus. To protect yourself:   Do not travel to areas where COVID-19 is a risk. The areas where COVID-19 is reported change often. To identify high-risk areas and travel restrictions, check the CDC travel website: StageSync.siwwwnc.cdc.gov/travel/notices  If you live in, or must travel to, an area where COVID-19 is a risk, take precautions to avoid infection. ? Stay away  from people who are sick. ? Wash your hands often with soap and water for 20 seconds. If soap and water are not available, use an alcohol-based hand sanitizer. ? Avoid touching your mouth, face, eyes, or nose. ? Avoid going out in public, follow guidance from your state and local health authorities. ? If you must go out in public, wear a cloth face covering or face mask. ? Disinfect objects and surfaces that are frequently touched every day. This may include:  Counters and tables.  Doorknobs and light switches.  Sinks and faucets.  Electronics, such as phones, remote controls, keyboards, computers, and tablets. To protect others: If you have symptoms of COVID-19, take steps to prevent the virus from spreading to others.  If you think you have a COVID-19 infection, contact your health care provider right away. Tell your health care team that you think you may  have a COVID-19 infection.  Stay home. Leave your house only to seek medical care. Do not use public transport.  Do not travel while you are sick.  Wash your hands often with soap and water for 20 seconds. If soap and water are not available, use alcohol-based hand sanitizer.  Stay away from other members of your household. Let healthy household members care for children and pets, if possible. If you have to care for children or pets, wash your hands often and wear a mask. If possible, stay in your own room, separate from others. Use a different bathroom.  Make sure that all people in your household wash their hands well and often.  Cough or sneeze into a tissue or your sleeve or elbow. Do not cough or sneeze into your hand or into the air.  Wear a cloth face covering or face mask. Where to find more information  Centers for Disease Control and Prevention: PurpleGadgets.be  World Health Organization: https://www.castaneda.info/ Contact a health care provider if:  You live in or have traveled to an area where COVID-19 is a risk and you have symptoms of the infection.  You have had contact with someone who has COVID-19 and you have symptoms of the infection. Get help right away if:  You have trouble breathing.  You have pain or pressure in your chest.  You have confusion.  You have bluish lips and fingernails.  You have difficulty waking from sleep.  You have symptoms that get worse. These symptoms may represent a serious problem that is an emergency. Do not wait to see if the symptoms will go away. Get medical help right away. Call your local emergency services (911 in the [place] Do not drive yourself to the hospital. Let the emergency medical personnel know if you think you have COVID-19. Summary  COVID-19 is a respiratory infection that is caused by a virus. It is also known as coronavirus disease or novel coronavirus. It can cause  serious infections, such as pneumonia, acute respiratory distress syndrome, acute respiratory failure, or sepsis.  The virus that causes COVID-19 is contagious. This means that it can spread from person to person through droplets from coughs and sneezes.  You are more likely to develop a serious illness if you are 85 years of age or older, have a weak immunity, live in a nursing home, or have chronic disease.  There is no medicine to treat COVID-19. Your health care provider will talk with you about ways to treat your symptoms.  Take steps to protect yourself and others from infection. Wash your hands often and disinfect  objects and surfaces that are frequently touched every day. Stay away from people who are sick and wear a mask if you are sick. This information is not intended to replace advice given to you by your health care provider. Make sure you discuss any questions you have with your health care provider. Document Released: 08/31/2018 Document Revised: 12/21/2018 Document Reviewed: 08/31/2018 Elsevier Patient Education  2020 [employer].   COVID-19 Frequently Asked Questions COVID-19 (coronavirus disease) is an infection that is caused by a large family of viruses. Some viruses cause illness in people and others cause illness in animals like camels, cats, and bats. In some cases, the viruses that cause illness in animals can spread to humans. Where did the coronavirus come from? In December 2019, [place] told the [employer] [facility]) of several cases of lung disease (human respiratory illness). These cases were linked to an open seafood and livestock market in the city of [place]. The link to the seafood and livestock market suggests that the virus may have spread from animals to humans. However, since that first outbreak in December, the virus has also been shown to spread from person to person. What is the name of the disease and the virus? Disease name Early on, this disease  was called novel coronavirus. This is because scientists determined that the disease was caused by a new (novel) respiratory virus. The World Health Organization [facility]) has now named the disease COVID-19, or coronavirus disease. Virus name The virus that causes the disease is called severe acute respiratory syndrome coronavirus 2 (SARS-CoV-2). More information on disease and virus naming World Health Organization [facility]): www.who.int/emergencies/diseases/novel-coronavirus-2019/technical-guidance/naming-the-coronavirus-disease-(covid-2019)-and-the-virus-that-causes-it Who is at risk for complications from coronavirus disease? Some people may be at higher risk for complications from coronavirus disease. This includes older adults and people who have chronic diseases, such as heart disease, diabetes, and lung disease. If you are at higher risk for complications, take these extra precautions:  Avoid close contact with people who are sick or have a fever or cough. Stay at least 3-6 ft (1-2 m) away from them, if possible.  Wash your hands often with soap and water for at least 20 seconds.  Avoid touching your face, mouth, nose, or eyes.  Keep supplies on hand at home, such as food, medicine, and cleaning supplies.  Stay home as much as possible.  Avoid social gatherings and travel. How does coronavirus disease spread? The virus that causes coronavirus disease spreads easily from person to person (is contagious). There are also cases of community-spread disease. This means the disease has spread to:  People who have no known contact with other infected people.  People who have not traveled to areas where there are known cases. It appears to spread from one person to another through droplets from coughing or sneezing. Can I get the virus from touching surfaces or objects? There is still a lot that we do not know about the virus that causes coronavirus disease. Scientists are basing a lot of  information on what they know about similar viruses, such as:  Viruses cannot generally survive on surfaces for long. They need a human body (host) to survive.  It is more likely that the virus is spread by close contact with people who are sick (direct contact), such as through: ? Shaking hands or hugging. ? Breathing in respiratory droplets that travel through the air. This can happen when an infected person coughs or sneezes on or near other people.  It is less likely that  the virus is spread when a person touches a surface or object that has the virus on it (indirect contact). The virus may be able to enter the body if the person touches a surface or object and then touches his or her face, eyes, nose, or mouth. Can a person spread the virus without having symptoms of the disease? It may be possible for the virus to spread before a person has symptoms of the disease, but this is most likely not the main way the virus is spreading. It is more likely for the virus to spread by being in close contact with people who are sick and breathing in the respiratory droplets of a sick person's cough or sneeze. What are the symptoms of coronavirus disease? Symptoms vary from person to person and can range from mild to severe. Symptoms may include:  Fever.  Cough.  Tiredness, weakness, or fatigue.  Fast breathing or feeling short of breath. These symptoms can appear anywhere from 2 to 14 days after you have been exposed to the virus. If you develop symptoms, call your health care provider. People with severe symptoms may need hospital care. If I am exposed to the virus, how long does it take before symptoms start? Symptoms of coronavirus disease may appear anywhere from 2 to 14 days after a person has been exposed to the virus. If you develop symptoms, call your health care provider. Should I be tested for this virus? Your health care provider will decide whether to test you based on your symptoms,  history of exposure, and your risk factors. How does a health care provider test for this virus? Health care providers will collect samples to send for testing. Samples may include:  Taking a swab of fluid from the nose.  Taking fluid from the lungs by having you cough up mucus (sputum) into a sterile cup.  Taking a blood sample.  Taking a stool or urine sample. Is there a treatment or vaccine for this virus? Currently, there is no vaccine to prevent coronavirus disease. Also, there are no medicines like antibiotics or antivirals to treat the virus. A person who becomes sick is given supportive care, which means rest and fluids. A person may also relieve his or her symptoms by using over-the-counter medicines that treat sneezing, coughing, and runny nose. These are the same medicines that a person takes for the common cold. If you develop symptoms, call your health care provider. People with severe symptoms may need hospital care. What can I do to protect myself and my family from this virus?     You can protect yourself and your family by taking the same actions that you would take to prevent the spread of other viruses. Take the following actions:  Wash your hands often with soap and water for at least 20 seconds. If soap and water are not available, use alcohol-based hand sanitizer.  Avoid touching your face, mouth, nose, or eyes.  Cough or sneeze into a tissue, sleeve, or elbow. Do not cough or sneeze into your hand or the air. ? If you cough or sneeze into a tissue, throw it away immediately and wash your hands.  Disinfect objects and surfaces that you frequently touch every day.  Avoid close contact with people who are sick or have a fever or cough. Stay at least 3-6 ft (1-2 m) away from them, if possible.  Stay home if you are sick, except to get medical care. Call your health care provider before you  get medical care.  Make sure your vaccines are up to date. Ask your health  care provider what vaccines you need. What should I do if I need to travel? Follow travel recommendations from your local health authority, the CDC, and WHO. Travel information and advice  Centers for Disease Control and Prevention (CDC): GeminiCard.glwww.cdc.gov/coronavirus/2019-ncov/travelers/index.html  World Health Organization [facility](WHO): PreviewDomains.sewww.who.int/emergencies/diseases/novel-coronavirus-2019/travel-advice Know the risks and take action to protect your health  You are at higher risk of getting coronavirus disease if you are traveling to areas with an outbreak or if you are exposed to travelers from areas with an outbreak.  Wash your hands often and practice good hygiene to lower the risk of catching or spreading the virus. What should I do if I am sick? General instructions to stop the spread of infection  Wash your hands often with soap and water for at least 20 seconds. If soap and water are not available, use alcohol-based hand sanitizer.  Cough or sneeze into a tissue, sleeve, or elbow. Do not cough or sneeze into your hand or the air.  If you cough or sneeze into a tissue, throw it away immediately and wash your hands.  Stay home unless you must get medical care. Call your health care provider or local health authority before you get medical care.  Avoid public areas. Do not take public transportation, if possible.  If you can, wear a mask if you must go out of the house or if you are in close contact with someone who is not sick. Keep your home clean  Disinfect objects and surfaces that are frequently touched every day. This may include: ? Counters and tables. ? Doorknobs and light switches. ? Sinks and faucets. ? Electronics such as phones, remote controls, keyboards, computers, and tablets.  Wash dishes in hot, soapy water or use a dishwasher. Air-dry your dishes.  Wash laundry in hot water. Prevent infecting other household members  Let healthy household members care for children  and pets, if possible. If you have to care for children or pets, wash your hands often and wear a mask.  Sleep in a different bedroom or bed, if possible.  Do not share personal items, such as razors, toothbrushes, deodorant, combs, brushes, towels, and washcloths. Where to find more information Centers for Disease Control and Prevention (CDC)  Information and news updates: CardRetirement.czwww.cdc.gov/coronavirus/2019-ncov World Health Organization [facility](WHO)  Information and news updates: AffordableSalon.eswww.who.int/emergencies/diseases/novel-coronavirus-2019  Coronavirus health topic: https://thompson-craig.com/www.who.int/health-topics/coronavirus  Questions and answers on COVID-19: kruiseway.comwww.who.int/news-room/q-a-detail/q-a-coronaviruses  Global tracker: who.sprinklr.com American Academy of Pediatrics (AAP)  Information for families: www.healthychildren.org/English/health-issues/conditions/chest-lungs/Pages/2019-Novel-Coronavirus.aspx The coronavirus situation is changing rapidly. Check your local health authority website or the CDC and [facility] websites for updates and news. When should I contact a health care provider?  Contact your health care provider if you have symptoms of an infection, such as fever or cough, and you: ? Have been near anyone who is known to have coronavirus disease. ? Have come into contact with a person who is suspected to have coronavirus disease. ? Have traveled outside of the country. When should I get emergency medical care?  Get help right away by calling your local emergency services (911 in the [place] if you have: ? Trouble breathing. ? Pain or pressure in your chest. ? Confusion. ? Blue-tinged lips and fingernails. ? Difficulty waking from sleep. ? Symptoms that get worse. Let the emergency medical personnel know if you think you have coronavirus disease. Summary  A new respiratory virus is spreading from person to  person and causing COVID-19 (coronavirus disease).  The virus that causes COVID-19 appears to  spread easily. It spreads from one person to another through droplets from coughing or sneezing.  Older adults and those with chronic diseases are at higher risk of disease. If you are at higher risk for complications, take extra precautions.  There is currently no vaccine to prevent coronavirus disease. There are no medicines, such as antibiotics or antivirals, to treat the virus.  You can protect yourself and your family by washing your hands often, avoiding touching your face, and covering your coughs and sneezes. This information is not intended to replace advice given to you by your health care provider. Make sure you discuss any questions you have with your health care provider. Document Released: 11/21/2018 Document Revised: 11/21/2018 Document Reviewed: 11/21/2018 Elsevier Patient Education  2020 [employer].

## 2019-02-26 NOTE — Discharge Summary (Signed)
Physician Discharge Summary  George Gibson:[id number] DOB: 12/03/1975 DOA: 02/20/2019  PCP: Patient, No Pcp Per  Admit date: 02/20/2019 Discharge date: 02/26/2019  Admitted From: Home  Disposition:  Home   Recommendations for Outpatient Follow-up:  1. Follow up with PCP in 1-2 weeks 2. Please repeat LFTs in 1 month 3.   Home Health: None  Equipment/Devices: None  Discharge Condition: Good  CODE STATUS: FULL Diet recommendation: Regular  Brief/Interim Summary: [name] is a 43 y.o. F with morbid obesity, asthma who presented with few days progressive cough, SOB, feve and weakness.  Hypoxic to 80s and CXR with multifocal pneumonia.      PRINCIPAL HOSPITAL DIAGNOSIS: COVID-19    Discharge Diagnoses:   Coronavirus pneumonitis with acute hypoxic respiratory failure In setting of ongoing 2020 COVID-19 pandemic.  Patient was admitted and started on remdesivir and steroids.  Hep B serologies negative.  Quant gold indeterminate, but he has low risk (emigrated from [place] age 89, no known exposure to TB, no clinical suspicion for active TB, low suspicion for latent TB).  He was given Actemra x1.    He completed remdesivir and 7 days steroids, and was weaned off O2.  He was able to ambulate >100 feet with SpO2 >90% on room air.  He was able to sleep overnight without hypoxia on room air.   He should follow up with his PCP in 2-3 weeks.  If still fatigued, reasonable to consider physical therapy as an outpatient.  Although very unlikely, reasonable to maintain a suspicion for opportunistic infection or reactivation of TB at follow up with PCP.     Transaminitis From COVID.  -Needs repeat LFTs in 1 month  Obesity Hyperlipidemia  Asthma Stable, no wheezing or active asthma.  Hyperkalemia Resolved            Discharge Instructions  Discharge Instructions    Diet general   Complete by: As directed    Discharge instructions   Complete by: As directed    From  Dr. Maryfrances Bunnellanford: You were admitted for coronavirus (Also known as COVID-19)  You were treated with an anti-virus medicine ("remdesivir") and two anti-inflammatory medicines (on that was a steroid, and another called Actemra) while you were here.  You completed the course of the anti-virus medicine, remdesivir You should finish the course of steroids by taking dexamethasone 6 mg once daily in the morning for 2 more days  Have someone who is not infected with COVID go to the pharmacy to pick up your prescription.  If you develop a new cough, rash, fevers or night sweats, call your primary care doctor if these symptoms are mild, or come back to the ER if they are severe.  If you have any lingering cough, you should take the cough syrup we gave you here, Robitussin (with the ingredients "GUIAFENESIN" and "DEXTROMETHORPHAN")  You should purchase a pulse oximeter (a device to measure oxygen in the blood) and measure your oxygen level once daily until you feel normal.  If your oxygen level is below 88% and stays there for several hours, you should call your doctor.  HOW LONG TO REMAIN IN QUARANTINE: There is some uncertainty about this. The CDC and our local health departments recommend you isolate strictly until 10 days from your first symptoms AND at least 3 days from your last fever. My personal recommendation is more strict, and I recommend you isolate for 14 days from the time you were admitted, which was July 14th.  In the  end, the [facility] Department have the sole discretion to judge when you may leave your house or return to work.   You have no personal health conditions that require special self-isolation precautions.  Until the health department has cleared you to end your quarantine: If you have anyone in the home who has NOT had coronavirus:    -do not be in the same room with them until your self isolation is over    -wear a mask and have them wear a mask if you MUST be in  the same room    -clean all hard surfaces (counters, doors, tables) twice a day    -use a separate bathroom at all times    If you have lingering COVID symptoms of fatigue and feeling out of breath with walking around, you should talk to your doctor about physical therapy.   Increase activity slowly   Complete by: As directed      Allergies as of 02/26/2019   No Known Allergies     Medication List    STOP taking these medications   naproxen sodium 220 MG tablet Commonly known as: ALEVE     TAKE these medications   Advair HFA 115-21 MCG/ACT inhaler Generic drug: fluticasone-salmeterol Inhale 1 puff into the lungs 2 (two) times a day.   albuterol 108 (90 Base) MCG/ACT inhaler Commonly known as: VENTOLIN HFA Inhale 1-2 puffs into the lungs every 6 (six) hours as needed for wheezing or shortness of breath.   amphetamine-dextroamphetamine 30 MG tablet Commonly known as: ADDERALL Take 30 mg by mouth daily.   atorvastatin 20 MG tablet Commonly known as: LIPITOR Take 20 mg by mouth daily.   benzonatate 100 MG capsule Commonly known as: TESSALON Take 1 capsule (100 mg total) by mouth every 8 (eight) hours.   cetirizine 10 MG tablet Commonly known as: ZYRTEC Take 10 mg by mouth daily.   dexamethasone 6 MG tablet Commonly known as: DECADRON Take 1 tablet (6 mg total) by mouth daily.   fluticasone 50 MCG/ACT nasal spray Commonly known as: FLONASE Place 2 sprays into both nostrils daily.   ondansetron 4 MG tablet Commonly known as: ZOFRAN Take 1 tablet (4 mg total) by mouth every 8 (eight) hours as needed. What changed: reasons to take this       No Known Allergies  Consultations:  None   Procedures/Studies: Dg Chest Port 1 View  Result Date: 02/20/2019 CLINICAL DATA:  Cough, shortness of breath. EXAM: PORTABLE CHEST 1 VIEW COMPARISON:  Radiograph February 17, 2019. FINDINGS: Stable cardiomediastinal silhouette. Interval development of multiple patchy airspace  opacities throughout both lungs. No pneumothorax or pleural effusion is noted. The visualized skeletal structures are unremarkable. IMPRESSION: Interval development of multiple bilateral patchy airspace opacities is noted most consistent with multifocal pneumonia. Electronically Signed   By: Lupita Raider M.D.   On: 02/20/2019 15:02   Dg Chest Port 1 View  Result Date: 02/17/2019 CLINICAL DATA:  Shortness of breath history of COVID-19 exposure EXAM: PORTABLE CHEST 1 VIEW COMPARISON:  None. FINDINGS: No focal airspace disease or effusion. Normal cardiomediastinal silhouette. No pneumothorax. IMPRESSION: No active disease. Electronically Signed   By: Jasmine Pang M.D.   On: 02/17/2019 03:51       Subjective: Feeling well.  No dizziness, dyspnea, chest pain.  Mild cough, resolving.  No vomiting.  Discharge Exam: Vitals:   02/26/19 0900 02/26/19 0952  BP:    Pulse: (!) 102 (!) 120  Resp:  Temp:    SpO2: 95% 94%   Vitals:   02/26/19 0328 02/26/19 0845 02/26/19 0900 02/26/19 0952  BP: (!) 96/58 112/79    Pulse: 95 (!) 113 (!) 102 (!) 120  Resp:  18    Temp:  97.8 F (36.6 C)    TempSrc:  Oral    SpO2: 95%  95% 94%  Weight:      Height:        General: Pt is alert, awake, not in acute distress Cardiovascular: RRR, nl S1-S2, no murmurs appreciated.   No LE edema.   Respiratory: Normal respiratory rate and rhythm.  CTAB without rales or wheezes. Abdominal: Abdomen soft and non-tender.  No distension or HSM.   Neuro/Psych: Strength symmetric in upper and lower extremities.  Judgment and insight appear normal.   The results of significant diagnostics from this hospitalization (including imaging, microbiology, ancillary and laboratory) are listed below for reference.     Microbiology: Recent Results (from the past 240 hour(s))  SARS Coronavirus 2 [facility](Hospital order, Performed in [facility] hospital lab)     Status: Abnormal   Collection Time: 02/17/19  3:09 AM   Specimen:  Nasopharyngeal Swab  Result Value Ref Range Status   SARS Coronavirus 2 POSITIVE (A) NEGATIVE Final    Comment: RESULT CALLED TO, READ BACK BY AND VERIFIED WITH: T DOSTER,RN 02/17/19 0530 RHOLMES (NOTE) If result is NEGATIVE SARS-CoV-2 target nucleic acids are NOT DETECTED. The SARS-CoV-2 RNA is generally detectable in upper and lower  respiratory specimens during the acute phase of infection. The lowest  concentration of SARS-CoV-2 viral copies this assay can detect is 250  copies / mL. A negative result does not preclude SARS-CoV-2 infection  and should not be used as the sole basis for treatment or other  patient management decisions.  A negative result may occur with  improper specimen collection / handling, submission of specimen other  than nasopharyngeal swab, presence of viral mutation(s) within the  areas targeted by this assay, and inadequate number of viral copies  (<250 copies / mL). A negative result must be combined with clinical  observations, patient history, and epidemiological information. If result is POSITIVE SARS-CoV-2 target nucleic acids are DETECTED. The  SARS-CoV-2 RNA is generally detectable in upper and lower  respiratory specimens during the acute phase of infection.  Positive  results are indicative of active infection with SARS-CoV-2.  Clinical  correlation with patient history and other diagnostic information is  necessary to determine patient infection status.  Positive results do  not rule out bacterial infection or co-infection with other viruses. If result is PRESUMPTIVE POSTIVE SARS-CoV-2 nucleic acids MAY BE PRESENT.   A presumptive positive result was obtained on the submitted specimen  and confirmed on repeat testing.  While 2019 novel coronavirus  (SARS-CoV-2) nucleic acids may be present in the submitted sample  additional confirmatory testing may be necessary for epidemiological  and / or clinical management purposes  to differentiate between   SARS-CoV-2 and other Sarbecovirus currently known to infect humans.  If clinically indicated additional testing with an alternate test  methodology [id number]) is ad vised. The SARS-CoV-2 RNA is generally  detectable in upper and lower respiratory specimens during the acute  phase of infection. The expected result is Negative. Fact Sheet for Patients:  BoilerBrush.com.cyhttps://www.fda.gov/media/136312/download Fact Sheet for Healthcare Providers: https://pope.com/https://www.fda.gov/media/136313/download This test is not yet approved or cleared by the [place] FDA and has been authorized for detection and/or diagnosis of SARS-CoV-2 by FDA  under an Emergency Use Authorization (EUA).  This EUA will remain in effect (meaning this test can be used) for the duration of the COVID-19 declaration under Section 564(b)(1) of the Act, 21 U.S.C. section 360bbb-3(b)(1), unless the authorization is terminated or revoked sooner. Performed at [facility], [place] [address], [place], [place] [address]   Blood Culture (routine x 2)     Status: None   Collection Time: 02/17/19  3:09 AM   Specimen: BLOOD LEFT HAND  Result Value Ref Range Status   Specimen Description   Final    BLOOD LEFT HAND Performed at [place] [address], [place], [place] [address]    Special Requests   Final    BOTTLES DRAWN AEROBIC AND ANAEROBIC Blood Culture adequate volume Performed at [place] [address] [place], [place] [address]    Culture   Final    NO GROWTH 5 DAYS Performed at [facility], [place] [address], [place], [place] [address]    Report Status 02/22/2019 FINAL  Final  Blood Culture (routine x 2)     Status: None   Collection Time: 02/17/19  3:14 AM   Specimen: BLOOD  Result Value Ref Range Status   Specimen Description   Final    BLOOD RIGHT ANTECUBITAL Performed at [place] [address] [place], [place] [address]    Special  Requests   Final    BOTTLES DRAWN AEROBIC AND ANAEROBIC Blood Culture adequate volume Performed at [place] [address] [place], [place] [address]    Culture   Final    NO GROWTH 5 DAYS Performed at [facility], [place] [address], [place], [place] [address]    Report Status 02/22/2019 FINAL  Final  Blood Culture (routine x 2)     Status: None   Collection Time: 02/20/19  1:42 PM   Specimen: BLOOD LEFT HAND  Result Value Ref Range Status   Specimen Description   Final    BLOOD LEFT HAND Performed at [place] [address], [place], [place] [address]    Special Requests   Final    BOTTLES DRAWN AEROBIC AND ANAEROBIC Blood Culture adequate volume Performed at [place] [address] [place], [place] [address]    Culture   Final    NO GROWTH 5 DAYS Performed at [facility], [place] [address], [place], [place] [address]    Report Status 02/25/2019 FINAL  Final  Blood Culture (routine x 2)     Status: None   Collection Time: 02/20/19  1:50 PM   Specimen: BLOOD  Result Value Ref Range Status   Specimen Description   Final    BLOOD RIGHT ANTECUBITAL Performed at [place] [address], [place], [place] [address]    Special Requests   Final    BOTTLES DRAWN AEROBIC ONLY Blood Culture adequate volume Performed at [place] [address] [place], [place] [address]    Culture   Final    NO GROWTH 5 DAYS Performed at [facility], [place] [address], [place], [place] [address]    Report Status 02/25/2019 FINAL  Final     Labs: BNP (last 3 results) No results for input(s): BNP in the last 8760 hours. Basic Metabolic Panel: Recent Labs  Lab 02/21/19 0300 02/22/19 0245 02/23/19 0500 02/24/19 9323 02/25/19 0346 02/26/19 5573  NA 136 138 140 140 139 137  K 5.2* 4.8 4.3 4.4 4.4 4.1  CL 104 106 105 106 105 103  CO2 GLUCOSE 120* 123* 90 79 73 93  BUN 19 25* 25* 24* 21* 24*  CREATININE 1.18 0.95 0.88 0.89 0.91 0.99  CALCIUM 11.5* 11.4* 11.5* 11.3* 11.3* 11.2*  MG 1.7 1.9 2.0 2.1 2.0  --   PHOS 2.0*  --   --   --   --   --    Liver Function Tests: Recent Labs  Lab 02/22/19 0245 02/23/19 0500 02/24/19 0336 02/25/19 0346 02/26/19 0353  AST 126* 97* 85* 67* 64*  ALT 64* 72* 90* 92* 104*  ALKPHOS 70 70 69 68 78  BILITOT 0.5 0.5 0.6 0.4 0.7  PROT 6.9 6.9 6.6 6.3* 6.6  ALBUMIN 3.1* 3.2* 3.2* 3.1* 3.3*   No results for input(s): LIPASE, AMYLASE in the last 168 hours. No results for input(s): AMMONIA in the last 168 hours. CBC: Recent Labs  Lab 02/20/19 1350  02/22/19 0245 02/23/19 0500 02/24/19 0336 02/25/19 0346 02/26/19 0353  WBC 6.7   < > 8.0 8.1 6.5 7.3 10.2  NEUTROABS 5.4  --   --   --   --   --   --   HGB 15.5   < > 15.1 15.1 15.1 15.4 15.5  HCT 52.8*   < > 50.8 52.5* 51.9 53.2* 52.7*  MCV 74.2*   < > 73.8* 74.4* 73.8* 73.8* 73.3*  PLT 190   < > 269 309 317 348 384   < > = values in this interval not displayed.   Cardiac Enzymes: Recent Labs  Lab 02/21/19 0300  CKTOTAL 6,437*   BNP: Invalid input(s): POCBNP CBG: No results for input(s): GLUCAP in the last 168 hours. D-Dimer No results for input(s): DDIMER in the last 72 hours. Hgb A1c No results for input(s): HGBA1C in the last 72 hours. Lipid Profile No results for input(s): CHOL, HDL, LDLCALC, TRIG, CHOLHDL, LDLDIRECT in the last 72 hours. Thyroid function studies No results for input(s): TSH, T4TOTAL, T3FREE, THYROIDAB in the last 72 hours.  Invalid input(s): FREET3 Anemia work up No results for input(s): VITAMINB12, FOLATE, FERRITIN, TIBC, IRON, RETICCTPCT in the last 72 hours. Urinalysis No results found for: COLORURINE, APPEARANCEUR, LABSPEC, PHURINE, GLUCOSEU, HGBUR, BILIRUBINUR, KETONESUR, PROTEINUR, UROBILINOGEN, NITRITE, LEUKOCYTESUR Sepsis Labs Invalid input(s): PROCALCITONIN,  WBC,   LACTICIDVEN Microbiology Recent Results (from the past 240 hour(s))  SARS Coronavirus 2 [facility] order, Performed in [facility] hospital lab)     Status: Abnormal   Collection Time: 02/17/19  3:09 AM   Specimen: Nasopharyngeal Swab  Result Value Ref Range Status   SARS Coronavirus 2 POSITIVE (A) NEGATIVE Final    Comment: RESULT CALLED TO, READ BACK BY AND VERIFIED WITH: T DOSTER,RN 02/17/19 0530 RHOLMES (NOTE) If result is NEGATIVE SARS-CoV-2 target nucleic acids are NOT DETECTED. The SARS-CoV-2 RNA is generally detectable in upper and lower  respiratory specimens during the acute phase of infection. The lowest  concentration of SARS-CoV-2 viral copies this assay can detect is 250  copies / mL. A negative result does not preclude SARS-CoV-2 infection  and should not be used as the sole basis for treatment or other  patient management decisions.  A negative result may occur with  improper specimen collection / handling, submission of specimen other  than nasopharyngeal swab, presence of viral mutation(s) within the  areas targeted by this  assay, and inadequate number of viral copies  (<250 copies / mL). A negative result must be combined with clinical  observations, patient history, and epidemiological information. If result is POSITIVE SARS-CoV-2 target nucleic acids are DETECTED. The  SARS-CoV-2 RNA is generally detectable in upper and lower  respiratory specimens during the acute phase of infection.  Positive  results are indicative of active infection with SARS-CoV-2.  Clinical  correlation with patient history and other diagnostic information is  necessary to determine patient infection status.  Positive results do  not rule out bacterial infection or co-infection with other viruses. If result is PRESUMPTIVE POSTIVE SARS-CoV-2 nucleic acids MAY BE PRESENT.   A presumptive positive result was obtained on the submitted specimen  and confirmed on repeat testing.  While 2019  novel coronavirus  (SARS-CoV-2) nucleic acids may be present in the submitted sample  additional confirmatory testing may be necessary for epidemiological  and / or clinical management purposes  to differentiate between  SARS-CoV-2 and other Sarbecovirus currently known to infect humans.  If clinically indicated additional testing with an alternate test  methodology [phone]) is ad vised. The SARS-CoV-2 RNA is generally  detectable in upper and lower respiratory specimens during the acute  phase of infection. The expected result is Negative. Fact Sheet for Patients:  BoilerBrush.com.cy Fact Sheet for Healthcare Providers: https://pope.com/ This test is not yet approved or cleared by the [place] FDA and has been authorized for detection and/or diagnosis of SARS-CoV-2 by FDA under an Emergency Use Authorization (EUA).  This EUA will remain in effect (meaning this test can be used) for the duration of the COVID-19 declaration under Section 564(b)(1) of the Act, 21 U.S.C. section 360bbb-3(b)(1), unless the authorization is terminated or revoked sooner. Performed at [facility], [address], [place], [place] [address]   Blood Culture (routine x 2)     Status: None   Collection Time: 02/17/19  3:09 AM   Specimen: BLOOD LEFT HAND  Result Value Ref Range Status   Specimen Description   Final    BLOOD LEFT HAND Performed at [facility], [address], [place], [place] [address]    Special Requests   Final    BOTTLES DRAWN AEROBIC AND ANAEROBIC Blood Culture adequate volume Performed at [facility], [address], [place], [place] [address]    Culture   Final    NO GROWTH 5 DAYS Performed at [facility], [address] [place], [place] [address]    Report Status 02/22/2019 FINAL  Final  Blood Culture (routine x 2)     Status: None   Collection Time: 02/17/19   3:14 AM   Specimen: BLOOD  Result Value Ref Range Status   Specimen Description   Final    BLOOD RIGHT ANTECUBITAL Performed at [facility], [address], [place], [place] [address]    Special Requests   Final    BOTTLES DRAWN AEROBIC AND ANAEROBIC Blood Culture adequate volume Performed at [facility], [address] [place], [place] [address]    Culture   Final    NO GROWTH 5 DAYS Performed at [facility], [address] [place], [place] [address]    Report Status 02/22/2019 FINAL  Final  Blood Culture (routine x 2)     Status: None   Collection Time: 02/20/19  1:42 PM   Specimen: BLOOD LEFT HAND  Result Value Ref Range Status  Specimen Description   Final    BLOOD LEFT HAND Performed at [facility], [address] [place], [place] [address]    Special Requests   Final    BOTTLES DRAWN AEROBIC AND ANAEROBIC Blood Culture adequate volume Performed at [facility], [address] [place], [place] [address]    Culture   Final    NO GROWTH 5 DAYS Performed at [facility], [address] [place], [place] [address]    Report Status 02/25/2019 FINAL  Final  Blood Culture (routine x 2)     Status: None   Collection Time: 02/20/19  1:50 PM   Specimen: BLOOD  Result Value Ref Range Status   Specimen Description   Final    BLOOD RIGHT ANTECUBITAL Performed at [facility], [address] [place], [place] [address]    Special Requests   Final    BOTTLES DRAWN AEROBIC ONLY Blood Culture adequate volume Performed at [facility], [address] [place], [place] [address]    Culture   Final    NO GROWTH 5 DAYS Performed at [facility], [address] [place], [place] [address]    Report Status 02/25/2019 FINAL  Final     Time coordinating discharge: 25 minutes      SIGNED:   Alberteen Samhristopher P Ineta Sinning, MD  [facility] 02/26/2019, 11:47 AM

## 2020-12-07 IMAGING — DX PORTABLE CHEST - 1 VIEW
1 series · 1 of 1 positions shown · non-contrast
Comparison: Radiograph February 17, 2019.

CLINICAL DATA: Cough, shortness of breath.

EXAM:
PORTABLE CHEST 1 VIEW

[chest ap]
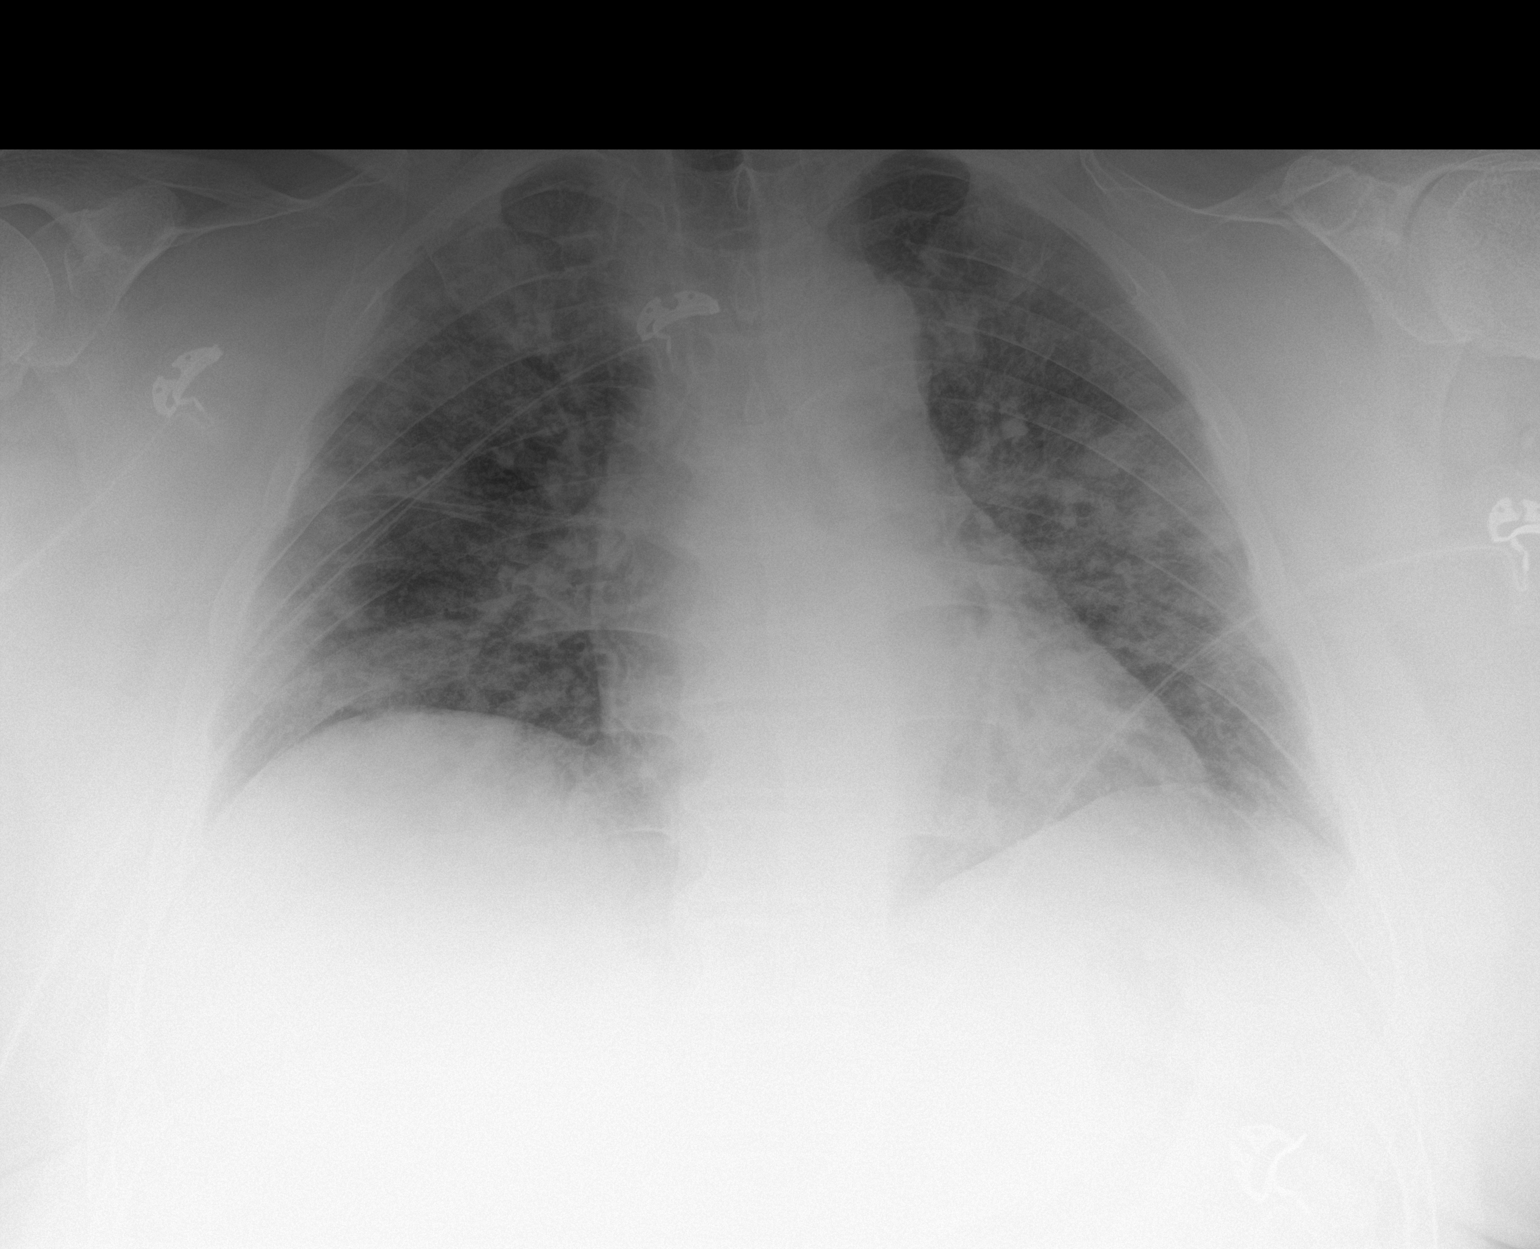

[1 of 1 positions shown; findings below may reference images not displayed]

FINDINGS: Stable cardiomediastinal silhouette. Interval development of
multiple patchy airspace opacities throughout both lungs. No
pneumothorax or pleural effusion is noted. The visualized skeletal
structures are unremarkable.
IMPRESSION: Interval development of multiple bilateral patchy airspace opacities
is noted most consistent with multifocal pneumonia.

## 2021-05-20 ENCOUNTER — Other Ambulatory Visit (HOSPITAL_COMMUNITY): Payer: Self-pay | Admitting: Oncology

## 2021-05-20 DIAGNOSIS — K760 Fatty (change of) liver, not elsewhere classified: Secondary | ICD-10-CM

## 2022-01-27 ENCOUNTER — Other Ambulatory Visit: Payer: Self-pay | Admitting: Nurse Practitioner

## 2022-01-27 DIAGNOSIS — E213 Hyperparathyroidism, unspecified: Secondary | ICD-10-CM

## 2022-02-02 ENCOUNTER — Other Ambulatory Visit: Payer: Self-pay | Admitting: Nurse Practitioner

## 2022-02-02 DIAGNOSIS — E213 Hyperparathyroidism, unspecified: Secondary | ICD-10-CM
